# Patient Record
Sex: Male | Born: 1950 | Race: White | Hispanic: No | Marital: Married | Smoking: Current every day smoker
Health system: Southern US, Community
[De-identification: ages and names within clinical notes are randomized; demographics above are authoritative.]

## PROBLEM LIST (undated history)

## (undated) DIAGNOSIS — M1990 Unspecified osteoarthritis, unspecified site: Secondary | ICD-10-CM

## (undated) DIAGNOSIS — L409 Psoriasis, unspecified: Secondary | ICD-10-CM

## (undated) DIAGNOSIS — G8929 Other chronic pain: Secondary | ICD-10-CM

## (undated) DIAGNOSIS — M9110 Juvenile osteochondrosis of head of femur [Legg-Calve-Perthes], unspecified leg: Secondary | ICD-10-CM

## (undated) DIAGNOSIS — I10 Essential (primary) hypertension: Secondary | ICD-10-CM

## (undated) HISTORY — PX: NOSE SURGERY: SHX723

---

## 2018-06-06 ENCOUNTER — Encounter: Payer: Self-pay | Admitting: *Deleted

## 2018-06-06 ENCOUNTER — Emergency Department: Payer: Medicare Other

## 2018-06-06 ENCOUNTER — Other Ambulatory Visit: Payer: Self-pay

## 2018-06-06 ENCOUNTER — Inpatient Hospital Stay
Admission: EM | Admit: 2018-06-06 | Discharge: 2018-06-10 | DRG: 064 | Disposition: A | Discharge status: Other Acute Inpatient Hospital | Payer: Medicare Other | Attending: Internal Medicine | Admitting: Internal Medicine

## 2018-06-06 ENCOUNTER — Inpatient Hospital Stay: Payer: Medicare Other

## 2018-06-06 DIAGNOSIS — E869 Volume depletion, unspecified: Secondary | ICD-10-CM | POA: Diagnosis present

## 2018-06-06 DIAGNOSIS — F17290 Nicotine dependence, other tobacco product, uncomplicated: Secondary | ICD-10-CM | POA: Diagnosis present

## 2018-06-06 DIAGNOSIS — Z886 Allergy status to analgesic agent status: Secondary | ICD-10-CM | POA: Diagnosis not present

## 2018-06-06 DIAGNOSIS — F419 Anxiety disorder, unspecified: Secondary | ICD-10-CM | POA: Diagnosis present

## 2018-06-06 DIAGNOSIS — I639 Cerebral infarction, unspecified: Secondary | ICD-10-CM | POA: Diagnosis present

## 2018-06-06 DIAGNOSIS — Z9911 Dependence on respirator [ventilator] status: Secondary | ICD-10-CM

## 2018-06-06 DIAGNOSIS — I10 Essential (primary) hypertension: Secondary | ICD-10-CM | POA: Diagnosis present

## 2018-06-06 DIAGNOSIS — R451 Restlessness and agitation: Secondary | ICD-10-CM | POA: Diagnosis present

## 2018-06-06 DIAGNOSIS — Z79899 Other long term (current) drug therapy: Secondary | ICD-10-CM

## 2018-06-06 DIAGNOSIS — J690 Pneumonitis due to inhalation of food and vomit: Secondary | ICD-10-CM | POA: Diagnosis present

## 2018-06-06 DIAGNOSIS — L4050 Arthropathic psoriasis, unspecified: Secondary | ICD-10-CM | POA: Diagnosis present

## 2018-06-06 DIAGNOSIS — I340 Nonrheumatic mitral (valve) insufficiency: Secondary | ICD-10-CM | POA: Diagnosis not present

## 2018-06-06 DIAGNOSIS — Z978 Presence of other specified devices: Secondary | ICD-10-CM

## 2018-06-06 DIAGNOSIS — J189 Pneumonia, unspecified organism: Secondary | ICD-10-CM

## 2018-06-06 DIAGNOSIS — J9811 Atelectasis: Secondary | ICD-10-CM | POA: Diagnosis present

## 2018-06-06 DIAGNOSIS — L409 Psoriasis, unspecified: Secondary | ICD-10-CM | POA: Diagnosis present

## 2018-06-06 DIAGNOSIS — R29703 NIHSS score 3: Secondary | ICD-10-CM | POA: Diagnosis present

## 2018-06-06 DIAGNOSIS — Z8051 Family history of malignant neoplasm of kidney: Secondary | ICD-10-CM

## 2018-06-06 DIAGNOSIS — G8929 Other chronic pain: Secondary | ICD-10-CM | POA: Diagnosis present

## 2018-06-06 DIAGNOSIS — J181 Lobar pneumonia, unspecified organism: Secondary | ICD-10-CM | POA: Diagnosis not present

## 2018-06-06 DIAGNOSIS — I959 Hypotension, unspecified: Secondary | ICD-10-CM | POA: Diagnosis present

## 2018-06-06 DIAGNOSIS — Z833 Family history of diabetes mellitus: Secondary | ICD-10-CM | POA: Diagnosis not present

## 2018-06-06 DIAGNOSIS — R42 Dizziness and giddiness: Secondary | ICD-10-CM | POA: Diagnosis present

## 2018-06-06 DIAGNOSIS — N170 Acute kidney failure with tubular necrosis: Secondary | ICD-10-CM | POA: Diagnosis not present

## 2018-06-06 DIAGNOSIS — G4700 Insomnia, unspecified: Secondary | ICD-10-CM | POA: Diagnosis present

## 2018-06-06 DIAGNOSIS — D649 Anemia, unspecified: Secondary | ICD-10-CM | POA: Diagnosis present

## 2018-06-06 DIAGNOSIS — J9621 Acute and chronic respiratory failure with hypoxia: Secondary | ICD-10-CM | POA: Diagnosis not present

## 2018-06-06 DIAGNOSIS — J9690 Respiratory failure, unspecified, unspecified whether with hypoxia or hypercapnia: Secondary | ICD-10-CM | POA: Diagnosis present

## 2018-06-06 DIAGNOSIS — I739 Peripheral vascular disease, unspecified: Secondary | ICD-10-CM | POA: Diagnosis present

## 2018-06-06 DIAGNOSIS — R739 Hyperglycemia, unspecified: Secondary | ICD-10-CM | POA: Diagnosis present

## 2018-06-06 DIAGNOSIS — I63431 Cerebral infarction due to embolism of right posterior cerebral artery: Secondary | ICD-10-CM | POA: Diagnosis not present

## 2018-06-06 DIAGNOSIS — R1310 Dysphagia, unspecified: Secondary | ICD-10-CM | POA: Diagnosis present

## 2018-06-06 DIAGNOSIS — Z4659 Encounter for fitting and adjustment of other gastrointestinal appliance and device: Secondary | ICD-10-CM

## 2018-06-06 DIAGNOSIS — J9600 Acute respiratory failure, unspecified whether with hypoxia or hypercapnia: Secondary | ICD-10-CM

## 2018-06-06 DIAGNOSIS — M47812 Spondylosis without myelopathy or radiculopathy, cervical region: Secondary | ICD-10-CM | POA: Diagnosis present

## 2018-06-06 DIAGNOSIS — J9601 Acute respiratory failure with hypoxia: Secondary | ICD-10-CM | POA: Diagnosis present

## 2018-06-06 DIAGNOSIS — I6389 Other cerebral infarction: Secondary | ICD-10-CM | POA: Diagnosis present

## 2018-06-06 DIAGNOSIS — Z8249 Family history of ischemic heart disease and other diseases of the circulatory system: Secondary | ICD-10-CM

## 2018-06-06 DIAGNOSIS — Z8041 Family history of malignant neoplasm of ovary: Secondary | ICD-10-CM

## 2018-06-06 DIAGNOSIS — Z7982 Long term (current) use of aspirin: Secondary | ICD-10-CM

## 2018-06-06 DIAGNOSIS — Z01818 Encounter for other preprocedural examination: Secondary | ICD-10-CM

## 2018-06-06 DIAGNOSIS — Z800 Family history of malignant neoplasm of digestive organs: Secondary | ICD-10-CM

## 2018-06-06 HISTORY — DX: Unspecified osteoarthritis, unspecified site: M19.90

## 2018-06-06 HISTORY — DX: Juvenile osteochondrosis of head of femur (Legg-Calve-Perthes), unspecified leg: M91.10

## 2018-06-06 HISTORY — DX: Essential (primary) hypertension: I10

## 2018-06-06 HISTORY — DX: Psoriasis, unspecified: L40.9

## 2018-06-06 HISTORY — DX: Other chronic pain: G89.29

## 2018-06-06 LAB — BLOOD GAS, ARTERIAL
Acid-Base Excess: 1.2 mmol/L (ref 0.0–2.0)
Bicarbonate: 26 mmol/L (ref 20.0–28.0)
FIO2: 0.5
O2 SAT: 90.8 %
PEEP: 5 cmH2O
Patient temperature: 37
RATE: 15 resp/min
VT: 500 mL
pCO2 arterial: 41 mmHg (ref 32.0–48.0)
pH, Arterial: 7.41 (ref 7.350–7.450)
pO2, Arterial: 60 mmHg — ABNORMAL LOW (ref 83.0–108.0)

## 2018-06-06 LAB — CBC WITH DIFFERENTIAL/PLATELET
ABS IMMATURE GRANULOCYTES: 0.07 10*3/uL (ref 0.00–0.07)
BASOS PCT: 0 %
Basophils Absolute: 0.1 10*3/uL (ref 0.0–0.1)
Eosinophils Absolute: 0 10*3/uL (ref 0.0–0.5)
Eosinophils Relative: 0 %
HCT: 45.2 % (ref 39.0–52.0)
Hemoglobin: 15 g/dL (ref 13.0–17.0)
Immature Granulocytes: 0 %
Lymphocytes Relative: 6 %
Lymphs Abs: 0.9 10*3/uL (ref 0.7–4.0)
MCH: 30.1 pg (ref 26.0–34.0)
MCHC: 33.2 g/dL (ref 30.0–36.0)
MCV: 90.8 fL (ref 80.0–100.0)
Monocytes Absolute: 1.2 10*3/uL — ABNORMAL HIGH (ref 0.1–1.0)
Monocytes Relative: 8 %
NEUTROS ABS: 13.7 10*3/uL — AB (ref 1.7–7.7)
NEUTROS PCT: 86 %
PLATELETS: 386 10*3/uL (ref 150–400)
RBC: 4.98 MIL/uL (ref 4.22–5.81)
RDW: 13.6 % (ref 11.5–15.5)
WBC: 16 10*3/uL — ABNORMAL HIGH (ref 4.0–10.5)
nRBC: 0 % (ref 0.0–0.2)

## 2018-06-06 LAB — TYPE AND SCREEN
ABO/RH(D): B POS
ANTIBODY SCREEN: NEGATIVE

## 2018-06-06 LAB — GLUCOSE, CAPILLARY: Glucose-Capillary: 132 mg/dL — ABNORMAL HIGH (ref 70–99)

## 2018-06-06 LAB — COMPREHENSIVE METABOLIC PANEL
ALBUMIN: 4.2 g/dL (ref 3.5–5.0)
ALT: 29 U/L (ref 0–44)
AST: 26 U/L (ref 15–41)
Alkaline Phosphatase: 77 U/L (ref 38–126)
Anion gap: 11 (ref 5–15)
BILIRUBIN TOTAL: 0.8 mg/dL (ref 0.3–1.2)
BUN: 16 mg/dL (ref 8–23)
CO2: 26 mmol/L (ref 22–32)
Calcium: 9.7 mg/dL (ref 8.9–10.3)
Chloride: 101 mmol/L (ref 98–111)
Creatinine, Ser: 0.73 mg/dL (ref 0.61–1.24)
GFR calc Af Amer: >60 mL/min (ref >60)
GLUCOSE: 138 mg/dL — AB (ref 70–99)
POTASSIUM: 4 mmol/L (ref 3.5–5.1)
Sodium: 138 mmol/L (ref 135–145)
TOTAL PROTEIN: 8.1 g/dL (ref 6.5–8.1)

## 2018-06-06 LAB — APTT: aPTT: 28 seconds (ref 24–36)

## 2018-06-06 LAB — URINALYSIS, ROUTINE W REFLEX MICROSCOPIC
BACTERIA UA: NONE SEEN
Bilirubin Urine: NEGATIVE
Glucose, UA: NEGATIVE mg/dL
HGB URINE DIPSTICK: NEGATIVE
Ketones, ur: NEGATIVE mg/dL
LEUKOCYTES UA: NEGATIVE
Nitrite: NEGATIVE
Protein, ur: 100 mg/dL — AB
SPECIFIC GRAVITY, URINE: 1.02 (ref 1.005–1.030)
Squamous Epithelial / HPF: NONE SEEN (ref 0–5)
pH: 7 (ref 5.0–8.0)

## 2018-06-06 LAB — MRSA PCR SCREENING: MRSA by PCR: NEGATIVE

## 2018-06-06 LAB — URINE DRUG SCREEN, QUALITATIVE (ARMC ONLY)
AMPHETAMINES, UR SCREEN: NOT DETECTED
BENZODIAZEPINE, UR SCRN: NOT DETECTED
Barbiturates, Ur Screen: NOT DETECTED
Cannabinoid 50 Ng, Ur ~~LOC~~: NOT DETECTED
Cocaine Metabolite,Ur ~~LOC~~: NOT DETECTED
MDMA (ECSTASY) UR SCREEN: NOT DETECTED
Methadone Scn, Ur: NOT DETECTED
Opiate, Ur Screen: NOT DETECTED
Phencyclidine (PCP) Ur S: NOT DETECTED
Tricyclic, Ur Screen: NOT DETECTED

## 2018-06-06 LAB — PROTIME-INR
INR: 0.93
Prothrombin Time: 12.4 seconds (ref 11.4–15.2)

## 2018-06-06 MED ORDER — ENOXAPARIN SODIUM 40 MG/0.4ML ~~LOC~~ SOLN: 40.0000 mg | SUBCUTANEOUS | Status: DC

## 2018-06-06 MED ORDER — FAMOTIDINE IN NACL 20-0.9 MG/50ML-% IV SOLN
20.0000 mg | INTRAVENOUS | Status: DC
  Administered 2018-06-06: 20 mg via INTRAVENOUS
  Filled 2018-06-06: qty 50

## 2018-06-06 MED ORDER — ETOMIDATE 2 MG/ML IV SOLN
30.0000 mg | Freq: Once | INTRAVENOUS | Status: AC
  Administered 2018-06-06: 30 mg via INTRAVENOUS

## 2018-06-06 MED ORDER — DEXMEDETOMIDINE HCL IN NACL 400 MCG/100ML IV SOLN
0.4000 ug/kg/h | INTRAVENOUS | Status: DC
  Administered 2018-06-06 (×3): 0.4 ug/kg/h via INTRAVENOUS
  Filled 2018-06-06 (×3): qty 100

## 2018-06-06 MED ORDER — SUCCINYLCHOLINE CHLORIDE 20 MG/ML IJ SOLN
150.0000 mg | Freq: Once | INTRAMUSCULAR | Status: AC
  Administered 2018-06-06: 150 mg via INTRAVENOUS

## 2018-06-06 MED ORDER — SODIUM CHLORIDE 0.9 % IV SOLN: INTRAVENOUS | Status: DC

## 2018-06-06 MED ORDER — FENTANYL 2500MCG IN NS 250ML (10MCG/ML) PREMIX INFUSION
0.0000 ug/h | INTRAVENOUS | Status: DC
  Administered 2018-06-06 (×2): 150 ug/h via INTRAVENOUS
  Administered 2018-06-09 (×2): 300 ug/h via INTRAVENOUS
  Administered 2018-06-09 – 2018-06-10 (×2): 400 ug/h via INTRAVENOUS
  Administered 2018-06-10: 300 ug/h via INTRAVENOUS
  Filled 2018-06-06 (×9): qty 250

## 2018-06-06 MED ORDER — ASPIRIN 300 MG RE SUPP
300.0000 mg | Freq: Once | RECTAL | Status: AC
  Administered 2018-06-06: 300 mg via RECTAL
  Filled 2018-06-06: qty 1

## 2018-06-06 MED ORDER — IOPAMIDOL (ISOVUE-370) INJECTION 76%
100.0000 mL | Freq: Once | INTRAVENOUS | Status: AC | PRN
  Administered 2018-06-06: 100 mL via INTRAVENOUS

## 2018-06-06 MED ORDER — STROKE: EARLY STAGES OF RECOVERY BOOK
Freq: Once | Status: AC
  Administered 2018-06-07: 18:00:00

## 2018-06-06 MED ORDER — ACETAMINOPHEN 650 MG RE SUPP: 650.0000 mg | RECTAL | Status: DC | PRN

## 2018-06-06 MED ORDER — ACETAMINOPHEN 160 MG/5ML PO SOLN
650.0000 mg | ORAL | Status: DC | PRN
  Administered 2018-06-07 – 2018-06-08 (×2): 650 mg
  Filled 2018-06-06 (×4): qty 20.3

## 2018-06-06 MED ORDER — SENNOSIDES-DOCUSATE SODIUM 8.6-50 MG PO TABS: 1.0000 | ORAL_TABLET | Freq: Every evening | ORAL | Status: DC | PRN

## 2018-06-06 MED ORDER — ONDANSETRON HCL 4 MG/2ML IJ SOLN
4.0000 mg | Freq: Four times a day (QID) | INTRAMUSCULAR | Status: DC | PRN
  Administered 2018-06-06 – 2018-06-10 (×3): 4 mg via INTRAVENOUS
  Filled 2018-06-06 (×4): qty 2

## 2018-06-06 MED ORDER — ACETAMINOPHEN 325 MG PO TABS
650.0000 mg | ORAL_TABLET | ORAL | Status: DC | PRN
  Administered 2018-06-06: 650 mg via ORAL
  Filled 2018-06-06 (×2): qty 2

## 2018-06-06 MED ORDER — ORAL CARE MOUTH RINSE
15.0000 mL | OROMUCOSAL | Status: DC
  Administered 2018-06-06 – 2018-06-10 (×34): 15 mL via OROMUCOSAL

## 2018-06-06 MED ORDER — SODIUM CHLORIDE 0.9 % IV SOLN
100.0000 mL/h | INTRAVENOUS | Status: DC
  Administered 2018-06-06 – 2018-06-07 (×3): 100 mL/h via INTRAVENOUS

## 2018-06-06 MED ORDER — SODIUM CHLORIDE 0.9 % IV BOLUS
500.0000 mL | Freq: Once | INTRAVENOUS | Status: AC
  Administered 2018-06-06: 500 mL via INTRAVENOUS

## 2018-06-06 MED ORDER — SODIUM CHLORIDE 0.9 % IV SOLN
3.0000 g | Freq: Four times a day (QID) | INTRAVENOUS | Status: DC
  Administered 2018-06-06 – 2018-06-10 (×17): 3 g via INTRAVENOUS
  Filled 2018-06-06 (×22): qty 3

## 2018-06-06 MED ORDER — CHLORHEXIDINE GLUCONATE 0.12% ORAL RINSE (MEDLINE KIT)
15.0000 mL | Freq: Two times a day (BID) | OROMUCOSAL | Status: DC
  Administered 2018-06-06 – 2018-06-10 (×9): 15 mL via OROMUCOSAL

## 2018-06-06 NOTE — Consult Note (Signed)
Pharmacy Antibiotic Note  Dillon Bradley is a 67 y.o. male admitted on 06/06/2018 with aspiration pneumonia.  Pharmacy has been consulted for Unasyn dosing.  Plan: Unasyn 3 gm IV every 6 hours  Height: 5\' 8"  (172.7 cm) Weight: 217 lb 9.5 oz (98.7 kg) IBW/kg (Calculated) : 68.4  Temp (24hrs), Avg:98.4 F (36.9 C), Min:98.4 F (36.9 C), Max:98.5 F (36.9 C)  Recent Labs  Lab 06/06/18 0634  WBC 16.0*  CREATININE 0.73    Estimated Creatinine Clearance: 100.6 mL/min (by C-G formula based on SCr of 0.73 mg/dL).    Allergies  Allergen Reactions  . Aspirin     Pt overdosed on ASA fifty years ago.    Antimicrobials this admission: Unasyn 11/20 >>    Dose adjustments this admission:   Microbiology results:  BCx:   UCx:    Sputum:    MRSA PCR:   Thank you for allowing pharmacy to be a part of this patient's care.  Orinda Kennerhris A Tajah Schreiner, PharmD Clinical Pharmacist 06/06/2018 4:43 PM

## 2018-06-06 NOTE — Progress Notes (Signed)
Initial Nutrition Assessment  DOCUMENTATION CODES:   Obesity unspecified  INTERVENTION:   If tube feeds initiated recommend vital HP @40ml /hr + Prostat 60ml BID  Free water flushes 30ml q 4 hours  Regimen provides 1360kcal/day, 144g/day protein, 154445ml/day free water   Recommend liquid MVI daily via tube   NUTRITION DIAGNOSIS:   Inadequate oral intake related to acute illness(pt ventilated ) as evidenced by NPO status.  GOAL:   Provide needs based on ASPEN/SCCM guidelines  MONITOR:   Vent status, Labs, Weight trends, Skin, I & O's  REASON FOR ASSESSMENT:   Ventilator    ASSESSMENT:   [age] y/o male admitted with stroke   Pt alert and oriented, following commands but remains intubated. OGT documented but no radiograph yet to confirm placement. Per chart, pt with insignificant weight loss pta. Suspect pt with good po intake pta. No plans for tube feeds today. RD will monitor for the need for nutrition support.   Medications reviewed and include: lovenox, NaCl @125ml /hr, precedex, fentanyl  Labs reviewed: wbc- 16.0(H)  Patient is currently intubated on ventilator support MV: 7.6 L/min Temp (24hrs), Avg:98.4 F (36.9 C), Min:98.4 F (36.9 C), Max:98.5 F (36.9 C)  Propofol: none  MAP- >5460mmHg  NUTRITION - FOCUSED PHYSICAL EXAM:    Most Recent Value  Orbital Region  No depletion  Upper Arm Region  No depletion  Thoracic and Lumbar Region  No depletion  Buccal Region  No depletion  Temple Region  No depletion  Clavicle Bone Region  No depletion  Clavicle and Acromion Bone Region  No depletion  Scapular Bone Region  No depletion  Dorsal Hand  No depletion  Patellar Region  No depletion  Anterior Thigh Region  No depletion  Posterior Calf Region  No depletion  Edema (RD Assessment)  None  Hair  Reviewed  Eyes  Reviewed  Mouth  Reviewed  Skin  Reviewed  Nails  Reviewed     Diet Order:   Diet Order            Diet NPO time specified  Diet effective  now             EDUCATION NEEDS:   No education needs have been identified at this time  Skin:  Skin Assessment: Reviewed RN Assessment  Last BM:  pta  Height:   Ht Readings from Last 1 Encounters:  06/06/18 5\' 8"  (1.727 m)    Weight:   Wt Readings from Last 1 Encounters:  06/06/18 98.7 kg    Ideal Body Weight:  70 kg  BMI:  Body mass index is 33.09 kg/m.  Estimated Nutritional Needs:   Kcal:  1086-1382kcal/day   Protein:  >140g/day   Fluid:  >2.1L/day   Betsey Holidayasey Alontae Chaloux MS, RD, LDN Pager #- [phone] Office#- [phone] After Hours Pager: [phone]

## 2018-06-06 NOTE — Plan of Care (Signed)
ET pulled back 1 cm and secure at 22 cm lip line

## 2018-06-06 NOTE — Progress Notes (Signed)
Pt was transported to CCU from the ED while on the vent. 

## 2018-06-06 NOTE — Consult Note (Addendum)
67 y  Name: Dillon Bradley MRN: [id number] DOB: 10-Apr-1951    ADMISSION DATE:  06/06/2018 CONSULTATION DATE: 06/06/2018  REFERRING MD : Dr. Manuella Ghazi   CHIEF COMPLAINT: Dizziness and Slurred Speech   BRIEF PATIENT DESCRIPTION:  67 yo male admitted with stroke-like symptoms concerning for acute CVA mechanically intubated for airway protection deemed not a TPA or interventional candidate   SIGNIFICANT EVENTS  11/20-Pt admitted to ICU with stroke-like symptoms mechanically intubated   STUDIES:  CT Head 11/20>>No acute intracranial abnormality. Atrophy and moderate chronic microvascular ischemia. ASPECTS is 10  HISTORY OF PRESENT ILLNESS:  This is a 67 yo male with a PMH of Psoriasis, Legg Calve Perthes Disease, HTN, Chronic Pain, and Arthritis.  She presented to [facility] ER via EMS on 11/20 with acute onset of severe dizziness.  Per ER notes the pts wife reported the pt had not been "acting right" for approximately 17 hrs prior to presentation.  He initially developed dizziness and imbalance primarily swaying to the right.  He also had difficulty swallowing/clearing his throat causing a gurgling sound, difficulty speaking, trouble finding his words, nausea/vomiting, and left facial numbness.  His symptoms worsened the night of 11/19, however the pt initially refused to come to the ER due to worsening symptoms pts wife notified EMS.  Upon arrival to the ER pt continued to have difficulty with speech and word finding.  He was also unable to clear his throat and control his secretions, therefore he was mechanically intubated for airway protection.  Code stroke initiated by ER physician, tele-neurology consulted.  Initial CT head revealed no acute intracranial abnormality APECTS 10, initial NIH stroke scale 3.  Per tele-neurology pt deemed not a candidate for tPA or interventional candidate as symptoms not consistent with a large vessel proximal occlusion. He was subsequently admitted to ICU for additional workup  and treatment.    PAST MEDICAL HISTORY :   has a past medical history of Arthritis, Chronic pain, Hypertension, Legg-Calve-Perthes disease, and Psoriasis.  has a past surgical history that includes Nose surgery. Prior to Admission medications   Medication Sig Start Date End Date Taking? Authorizing Provider  acetaminophen (TYLENOL) 325 MG tablet Take 650 mg by mouth every 6 (six) hours as needed.   Yes [provider]  calcium-vitamin D (OSCAL WITH D) 500-200 MG-UNIT tablet Take 1 tablet by mouth daily.   Yes [provider]  Coenzyme Q10 (COQ10) 100 MG CAPS Take 1 capsule by mouth daily.   Yes [provider]  diclofenac (VOLTAREN) 75 MG EC tablet Take 75 mg by mouth 2 (two) times daily.   Yes [provider]  Multiple Vitamins-Minerals (MULTIVITAMIN ADULTS 50+) TABS Take 1 tablet by mouth daily.   Yes [provider]   Allergies  Allergen Reactions  . Aspirin     Pt overdosed on ASA fifty years ago.    FAMILY HISTORY:  family history includes Arthritis in his brother; Colon cancer in his father; Diabetes in his brother; Hypertension in his father and mother; Kidney cancer in his father; Ovarian cancer in his sister; Stroke in his mother. SOCIAL HISTORY:  reports that he has been smoking cigars. He has never used smokeless tobacco. He reports that he drinks about 4.0 standard drinks of alcohol per week. He reports that he does not use drugs.  REVIEW OF SYSTEMS:   Unable to assess pt intubated   SUBJECTIVE:  Unable to assess pt intubated   VITAL SIGNS: Temp:  [98.4 F (36.9 C)-98.5 F (  36.9 C)] 98.4 F (36.9 C) (11/20 1100) Pulse Rate:  [55-106] 72 (11/20 1100) Resp:  [13-24] 23 (11/20 1100) BP: (107-213)/(72-135) 121/84 (11/20 1100) SpO2:  [94 %-100 %] 97 % (11/20 1149) FiO2 (%):  [40 %-60 %] 40 % (11/20 1149) Weight:  [98.7 kg-104.3 kg] 98.7 kg (11/20 1000)  PHYSICAL EXAMINATION: General: well developed, well nourished male,  NAD mechanically intubated Neuro: sedated, follows commands, BLU and BLL muscle strength 5/5. PERRL  HEENT: supple, no JVD Cardiovascular: sinus brady, no R/G  Lungs: clear throughout, even, non labored  Abdomen: +BS x4, obese, soft, non distended  Musculoskeletal: normal bulk and tone, no edema Skin: intact no rashes or lesions   Recent Labs  Lab 06/06/18 0634  NA 138  K 4.0  CL 101  CO2 26  BUN 16  CREATININE 0.73  GLUCOSE 138*   Recent Labs  Lab 06/06/18 0634  HGB 15.0  HCT 45.2  WBC 16.0*  PLT 386   Ct Angio Head W Or Wo Contrast  Result Date: 06/06/2018 CLINICAL DATA:  Dizziness.  Vomiting.  Stroke. EXAM: CT ANGIOGRAPHY HEAD AND NECK CT PERFUSION BRAIN TECHNIQUE: Multidetector CT imaging of the head and neck was performed using the standard protocol during bolus administration of intravenous contrast. Multiplanar CT image reconstructions and MIPs were obtained to evaluate the vascular anatomy. Carotid stenosis measurements (when applicable) are obtained utilizing NASCET criteria, using the distal internal carotid diameter as the denominator. Multiphase CT imaging of the brain was performed following IV bolus contrast injection. Subsequent parametric perfusion maps were calculated using RAPID software. CONTRAST:  1666m ISOVUE-370 IOPAMIDOL (ISOVUE-370) INJECTION 76% COMPARISON:  CT head 06/06/2018 FINDINGS: CTA NECK FINDINGS Aortic arch: Standard branching. Imaged portion shows no evidence of aneurysm or dissection. No significant stenosis of the major arch vessel origins. Right carotid system: Right carotid widely patent without significant stenosis or atherosclerotic disease Left carotid system: Left carotid widely patent. Mild atherosclerotic disease left carotid bifurcation. Vertebral arteries: Left vertebral artery dominant. Both vertebral arteries are patent in the neck. Distal left vertebral artery stenosis as described below. Skeleton: Cervical spine degenerative change. No  acute skeletal abnormality. Other neck: The patient is intubated. Endotracheal tube in satisfactory position. No soft tissue mass. Upper chest: Mild atelectasis in the lungs bilaterally. Considerable left coronary calcification. Review of the MIP images confirms the above findings CTA HEAD FINDINGS Anterior circulation: Atherosclerotic calcification cavernous carotid bilaterally with mild stenosis bilaterally. Anterior and middle cerebral arteries patent bilaterally without stenosis. Posterior circulation: Circumferential calcific moderate stenosis of the distal left vertebral artery. Right vertebral artery is diminutive but patent to the basilar. Basilar is widely patent. PICA patent bilaterally. Superior cerebellar and posterior cerebral arteries patent bilaterally without stenosis or occlusion. Venous sinuses: Patent Anatomic variants: None Delayed phase: Not performed. Review of the MIP images confirms the above findings CT Brain Perfusion Findings: CBF (<30%) Volume: 038mPerfusion (Tmax>6.0s) volume: 66m266mismatch Volume: 66mL63mfarction Location:Left anterior frontal lobe.  Possible artifact. IMPRESSION: 1. Negative for emergent large vessel occlusion 2. No significant carotid or vertebral artery stenosis in the neck. Moderate stenosis distal left vertebral artery due to calcific stenosis. Basilar widely patent. 3. 4 mL of delayed perfusion left anterior frontal lobe, probable artifact of rapid software Electronically Signed   By: CharFranchot Gallo.   On: 06/06/2018 08:55   Ct Angio Neck W Or Wo Contrast  Result Date: 06/06/2018 CLINICAL DATA:  Dizziness.  Vomiting.  Stroke. EXAM: CT ANGIOGRAPHY HEAD AND NECK CT PERFUSION  BRAIN TECHNIQUE: Multidetector CT imaging of the head and neck was performed using the standard protocol during bolus administration of intravenous contrast. Multiplanar CT image reconstructions and MIPs were obtained to evaluate the vascular anatomy. Carotid stenosis measurements (when  applicable) are obtained utilizing NASCET criteria, using the distal internal carotid diameter as the denominator. Multiphase CT imaging of the brain was performed following IV bolus contrast injection. Subsequent parametric perfusion maps were calculated using RAPID software. CONTRAST:  113m ISOVUE-370 IOPAMIDOL (ISOVUE-370) INJECTION 76% COMPARISON:  CT head 06/06/2018 FINDINGS: CTA NECK FINDINGS Aortic arch: Standard branching. Imaged portion shows no evidence of aneurysm or dissection. No significant stenosis of the major arch vessel origins. Right carotid system: Right carotid widely patent without significant stenosis or atherosclerotic disease Left carotid system: Left carotid widely patent. Mild atherosclerotic disease left carotid bifurcation. Vertebral arteries: Left vertebral artery dominant. Both vertebral arteries are patent in the neck. Distal left vertebral artery stenosis as described below. Skeleton: Cervical spine degenerative change. No acute skeletal abnormality. Other neck: The patient is intubated. Endotracheal tube in satisfactory position. No soft tissue mass. Upper chest: Mild atelectasis in the lungs bilaterally. Considerable left coronary calcification. Review of the MIP images confirms the above findings CTA HEAD FINDINGS Anterior circulation: Atherosclerotic calcification cavernous carotid bilaterally with mild stenosis bilaterally. Anterior and middle cerebral arteries patent bilaterally without stenosis. Posterior circulation: Circumferential calcific moderate stenosis of the distal left vertebral artery. Right vertebral artery is diminutive but patent to the basilar. Basilar is widely patent. PICA patent bilaterally. Superior cerebellar and posterior cerebral arteries patent bilaterally without stenosis or occlusion. Venous sinuses: Patent Anatomic variants: None Delayed phase: Not performed. Review of the MIP images confirms the above findings CT Brain Perfusion Findings: CBF (<30%)  Volume: 049mPerfusion (Tmax>6.0s) volume: 64m51mismatch Volume: 64mL46mfarction Location:Left anterior frontal lobe.  Possible artifact. IMPRESSION: 1. Negative for emergent large vessel occlusion 2. No significant carotid or vertebral artery stenosis in the neck. Moderate stenosis distal left vertebral artery due to calcific stenosis. Basilar widely patent. 3. 4 mL of delayed perfusion left anterior frontal lobe, probable artifact of rapid software Electronically Signed   By: CharFranchot Gallo.   On: 06/06/2018 08:55   Ct Cerebral Perfusion W Contrast  Result Date: 06/06/2018 CLINICAL DATA:  Dizziness.  Vomiting.  Stroke. EXAM: CT ANGIOGRAPHY HEAD AND NECK CT PERFUSION BRAIN TECHNIQUE: Multidetector CT imaging of the head and neck was performed using the standard protocol during bolus administration of intravenous contrast. Multiplanar CT image reconstructions and MIPs were obtained to evaluate the vascular anatomy. Carotid stenosis measurements (when applicable) are obtained utilizing NASCET criteria, using the distal internal carotid diameter as the denominator. Multiphase CT imaging of the brain was performed following IV bolus contrast injection. Subsequent parametric perfusion maps were calculated using RAPID software. CONTRAST:  100mL63mVUE-370 IOPAMIDOL (ISOVUE-370) INJECTION 76% COMPARISON:  CT head 06/06/2018 FINDINGS: CTA NECK FINDINGS Aortic arch: Standard branching. Imaged portion shows no evidence of aneurysm or dissection. No significant stenosis of the major arch vessel origins. Right carotid system: Right carotid widely patent without significant stenosis or atherosclerotic disease Left carotid system: Left carotid widely patent. Mild atherosclerotic disease left carotid bifurcation. Vertebral arteries: Left vertebral artery dominant. Both vertebral arteries are patent in the neck. Distal left vertebral artery stenosis as described below. Skeleton: Cervical spine degenerative change. No acute  skeletal abnormality. Other neck: The patient is intubated. Endotracheal tube in satisfactory position. No soft tissue mass. Upper chest: Mild atelectasis in the lungs bilaterally.  Considerable left coronary calcification. Review of the MIP images confirms the above findings CTA HEAD FINDINGS Anterior circulation: Atherosclerotic calcification cavernous carotid bilaterally with mild stenosis bilaterally. Anterior and middle cerebral arteries patent bilaterally without stenosis. Posterior circulation: Circumferential calcific moderate stenosis of the distal left vertebral artery. Right vertebral artery is diminutive but patent to the basilar. Basilar is widely patent. PICA patent bilaterally. Superior cerebellar and posterior cerebral arteries patent bilaterally without stenosis or occlusion. Venous sinuses: Patent Anatomic variants: None Delayed phase: Not performed. Review of the MIP images confirms the above findings CT Brain Perfusion Findings: CBF (<30%) Volume: 32m Perfusion (Tmax>6.0s) volume: 4323mMismatch Volume: 23m23mnfarction Location:Left anterior frontal lobe.  Possible artifact. IMPRESSION: 1. Negative for emergent large vessel occlusion 2. No significant carotid or vertebral artery stenosis in the neck. Moderate stenosis distal left vertebral artery due to calcific stenosis. Basilar widely patent. 3. 4 mL of delayed perfusion left anterior frontal lobe, probable artifact of rapid software Electronically Signed   By: ChaFranchot GalloD.   On: 06/06/2018 08:55   Dg Chest Portable 1 View  Result Date: 06/06/2018 CLINICAL DATA:  High hypoxia EXAM: PORTABLE CHEST 1 VIEW COMPARISON:  None. FINDINGS: Endotracheal tube tip is 2.2 cm above the carina. No pneumothorax. There is atelectatic change in the left base. There is no edema or consolidation. Heart is upper normal in size with pulmonary vascularity normal. No adenopathy. No bone lesions. IMPRESSION: Endotracheal tube as described without evident  pneumothorax. Mild left base atelectasis. No edema or consolidation. Heart size within normal limits. Electronically Signed   By: WilLowella GripI M.D.   On: 06/06/2018 08:43   Ct Head Code Stroke Wo Contrast  Result Date: 06/06/2018 CLINICAL DATA:  Code stroke. Dizziness. Altered level of consciousness. Abnormal speech. EXAM: CT HEAD WITHOUT CONTRAST TECHNIQUE: Contiguous axial images were obtained from the base of the skull through the vertex without intravenous contrast. COMPARISON:  None. FINDINGS: Brain: Mild atrophy. Chronic microvascular ischemic changes in the white matter. Multiple chronic infarcts in the basal ganglia and pons. Negative for hydrocephalus. Negative for acute infarct, hemorrhage, or mass lesion. Vascular: Atherosclerotic calcification. Negative for hyperdense vessel Skull: Negative Sinuses/Orbits: Mild mucosal edema paranasal sinuses.  Normal orbit Other: None ASPECTS ([place] Program Early CT Score) - Ganglionic level infarction (caudate, lentiform nuclei, internal capsule, insula, M1-M3 cortex): 7 - Supraganglionic infarction (M4-M6 cortex): 3 Total score (0-10 with 10 being normal): 10 IMPRESSION: 1. No acute intracranial abnormality 2. Atrophy and moderate chronic microvascular ischemia 3. ASPECTS is 10 4. These results were called by telephone at the time of interpretation on 06/06/2018 at 6:57 am to Dr. CORHinda Kehrwho verbally acknowledged these results. Electronically Signed   By: ChaFranchot GalloD.   On: 06/06/2018 06:58    ASSESSMENT / PLAN:  Acute hypoxic respiratory failure secondary to aspiration in setting of possible acute CVA  Mechanical Intubation  Hx: Current everyday smoker  Full vent support-vent settings reviewed and established SBT once all parameters met VAP bundle implemented  Prn bronchodilator therapy  Maintain RASS goal 0 to -1 Fentanyl and Precedex gtts to maintain RASS goal  WUA daily  Smoking cessation counseling once  mentation improves   Possible acute CVA Continuous telemetry monitoring Allow for permissive HTN  Echo and MRI brain pending  Frequent neuro checks per code stroke parameters Continue aspirin 300 mg daily Hgb A1c and fasting lipid panel pending  Neurology consulted appreciate input  PT/OT/Speech consulted appreciate input  Aspiration pneumonia and atelectasis. Rt. L airspace disease -Empiric Unasyn. Monitor CXR + CBC + FIO2  VTE px: SCD. Hold on using Heparin products because of risk of hemorrhagic transformation of Acute CVA. SUP px: iv pepcid   Marda Stalker, [place] Pager [phone] (please enter 7 digits) PCCM Consult Pager [phone] (please enter 7 digits)   I agree with the documented

## 2018-06-06 NOTE — Progress Notes (Signed)
Pt was transported to CT and back to ED while on the vent. 

## 2018-06-06 NOTE — Progress Notes (Signed)
Dr. Thad Rangereynolds notified of MRI results- no new orders.

## 2018-06-06 NOTE — Progress Notes (Signed)
OT Cancellation Note  Patient Details Name: Dillon Bradley MRN: [id number] DOB: 30-Oct-1950   Cancelled Treatment:    Reason Eval/Treat Not Completed: Other (comment). Order received, chart reviewed. Per PT, RN requesting hold therapy this date. Pt is currently intubated and becomes agitated when woken, and is also scheduled for an MRI this PM. Will re-attempt OT evaluation at later date/time as pt is medically appropriate.   Richrd PrimeJamie Stiller, MPH, MS, OTR/L ascom [phone] 06/06/18, 1:18 PM

## 2018-06-06 NOTE — ED Notes (Signed)
Pt take to CT with Velva HarmanLaura RN and RT.

## 2018-06-06 NOTE — Progress Notes (Signed)
PT Cancellation Note  Patient Details Name: Dillon CanBryan Bradley MRN: [id number] DOB: 08-Oct-1950   Cancelled Treatment:    Reason Eval/Treat Not Completed: Other (comment)(PT spoke with nursing staff about pt status. Patient is currently intubated and becomes agitated when woken, and is also scheduled for an MRI this PM. Nursing requesting PT to hold at this time. PT will follow up as able. )  Olga Coasteriana Manila Rommel PT, DPT 1:15 PM,06/06/18 [phone]

## 2018-06-06 NOTE — ED Triage Notes (Signed)
Pt c/o dizziness since Tues noon. Pt vomiting x 6 episodes since dizziness started. Pt's speech Is garbled and he is having difficulty saying what he wants to say. Pt states the aphasia has been persistent x several months prior. Pt's speech is gurgling.

## 2018-06-06 NOTE — ED Notes (Signed)
Dr. Sherryll BurgerShah notified of patient's blood pressure. No new orders given at this time. Will continue to monitor.

## 2018-06-06 NOTE — Care Management Note (Signed)
Case Management Note  Patient Details  Name: Dillon Bradley MRN: [id number] Date of Birth: Mar 11, 1951  Subjective/Objective:                 Patient presented from home for sx concerning for stroke.  Several day history of dizziness and aphasia. He was intubated for airway protection due to inability to clear his secretions. Has history of psoriatic arthritis. Per his wife, patient independent in all his adls prior to this episode of illness.  [facility]  [facility]  PT OT and SLP consults are pending. Neuro is following   Action/Plan:   Expected Discharge Date:                  Expected Discharge Plan:     In-House Referral:     Discharge planning Services     Post Acute Care Choice:    Choice offered to:     DME Arranged:    DME Agency:     HH Arranged:    HH Agency:     Status of Service:     If discussed at [employer] Length of Stay Meetings, dates discussed:    Additional Comments:  Eber HongGreene, Briaunna Grindstaff R, RN 06/06/2018, 9:24 AM

## 2018-06-06 NOTE — Progress Notes (Signed)
SLP Cancellation Note  Patient Details Name: Dillon Bradley MRN: [id number] DOB: [birth year]   Cancelled treatment:       Reason Eval/Treat Not Completed: Medical issues which prohibited therapy;Patient not medically ready(chart reviewed; consulted NSG. Pt is orally intubated. ). Pt required oral intubation this AM. ST services will f/u once pt has been extubated. NSG agreed.    Jerilynn Som , MS, CCC-SLP , 06/06/2018, 10:59 AM

## 2018-06-06 NOTE — Consult Note (Signed)
[facility] Tele[facility] Consult Services   TeleStroke Metrics: LKW: 1200 yesterday Door Time: 0626 [facility] Contacted: 0701 [facility] at Bedside: 0707 NIHSS: 0713 Decision on Alteplase: Not to give as his last known well time was yesterday afternoon. Interventional Candidate: Not a candidate as his symptoms are not consistent with a large vessel proximal occlusion.   Chief Complaint: Acute dizziness and slurred speech   HPI: Asked to see this patient in emergent telemedicine consultation utilizing interactive audio and video technologies. ?Consultation was performed with assistance of ancillary / medical staff at bedside.   Verbal consent to perform the examination with telemedicine was obtained. Patient agreed to proceed with the consultation for acute stroke protocol.  67 year old left-handed white male who comes to the emergency room for evaluation of persistent dizziness, slurred speech, and left facial numbness.  Patient's wife is at bedside.  Patient has a listed allergy to aspirin in his medical record.  Wife clarified that the patient has not had any actual anaphylaxis reaction or hives with aspirin.  He apparently overdosed on aspirin many years ago due to pain issues.  Starting around noontime yesterday, the patient had acute dizziness that he describes as room spinning and feeling off balance.  At some point along the way, he developed changes in his speech and left facial numbness.  Wife states that he continued to get worse where he was having nausea and vomiting and it was affecting his walking progressively.  Therefore, he was brought to the ER at this morning.  Patient has never had a stroke before.   PMH: Osteoarthritis and hypertension   SOC: Positive for tobacco abuse and alcohol use.  No illicit drug use.  Patient is married.   FMH: Mother with a history of possible stroke.   ROS: 13 point review systems were reviewed with the patient, and are all  negative with the exception of the aforementioned in the history of present illness.   VS: Temperature is 98.4 F, pulse 79, respiration 13, blood pressure 171/93, oxygen saturation 97%, weight 104 kg   Exam: Patient is in no apparent distress.  Patient appears as stated age.  No obvious acute respiratory or cardiac distress.  Patient is well groomed and well-nourished. 1a- LOC: Keenly responsive - 0     1b- LOC questions: Answers both questions correctly - 0    1c- LOC commands- Performs both tasks correctly- 0    2- Gaze: Normal; no gaze paresis or gaze deviation - 0    3- Visual Fields: normal, no Visual field deficit - 0    4- Facial movements: right facial palsy - 1    5- Upper limb motor - no drift - 0    6- Lower limb motor - bilateral leg drifts - 2     7- Limb Coordination: absent ataxia - 0     8- Sensory: left facial sensory loss - 1 9- Language - No aphasia - 0     10- Speech - Moderate dysarthria - 1 11- Neglect / Extinction - none found - 0   NIHSS score:  5   Diagnostic Data: CT of the head showed no acute intracranial process  Sodium 138, potassium 4, BUN 16, creatinine 0.73, blood glucose 138, WBC 16, hemoglobin 15, platelets 386   Medical Data Reviewed:   1.Data?reviewed include clinical labs, radiology,?and medical tests;   2.Tests?results discussed w/performing or interpreting physician;   3.Obtaining/reviewing old medical records;   4.Obtaining?case history from another source;   5.Independent?review of image,  tracing, or specimen.    Medical Decision Making:   - Extensive number of diagnosis or management options are considered below.   - Extensive amount of complex data reviewed.   - High risk of complication and/or morbidity or mortality are associated with differential diagnostic considerations below.   - There may be?uncertain?outcome and increased probability of prolonged functional impairment or high probability of severe prolonged functional impairment  associated with some of these differential diagnosis.    Differential Diagnosis for Stroke:   1.?Cardioembolic?stroke   2. Small vessel disease/lacune   3. Thromboembolic, artery-to-artery mechanism   4.?Hypercoagulable?state-related infarct   5. Transient ischemic attack   6. Thrombotic mechanism, large artery disease    Assessment: 1.  Acute posterior circulation/brainstem stroke 2.  Hypertension 3.  Osteoarthritis 4.  Tobacco abuse   Recommendations: Check CTA of the head and neck to evaluate his intracranial and extracranial blood vessels, with particular attention to the posterior circulation. Patient can be admitted to the hospital for further work-up of his symptoms. Consult local neurology team to assist with evaluation and management. Start the patient on a baby aspirin. Allow permissive hypertension. Check MRI brain without contrast to rule out any acute intracranial process. Check echocardiogram to gauge his cardiac function. Maintain the patient on telemetry to look for paroxysmal atrial fibrillation. Check hemoglobin A1c and lipid panel. Consult PT, OT, and ST. Continue supportive care. Plan of care was discussed with the patient and his wife.   Thank you for allowing [facility] to participate in the care of your patient. Please call me, Dr. Adrienne MochaSombutmai, with any questions at [phone]. Case discussed with the ER staff and Dr. Scotty CourtStafford.   Critical Care notation:   I was called to see this critical patient emergently. I personally evaluated this critical patient for acute stroke evaluation, and determining their eligibility for IV Alteplase and interventional therapies.  I have spent approximately 10 minutes with the patient, including time at bedside, time discussing the case with other physicians, reviewing plan of care, and time independently reviewing the records and scans.

## 2018-06-06 NOTE — H&P (Addendum)
[facility] [place] [facility]   PATIENT NAME: Dillon Bradley    MR#:  [id number]  DATE OF BIRTH:  01-Aug-1950  DATE OF ADMISSION:  06/06/2018  PRIMARY CARE PHYSICIAN: Carolynn Comment  REQUESTING/REFERRING PHYSICIAN: Loleta Rose, MD  CHIEF COMPLAINT:   Chief Complaint  Patient presents with  . Dizziness    HISTORY OF PRESENT ILLNESS:  Dillon Bradley  is a 67 y.o. male with a known history of Psoriatic arthritis, hypertension, chronic back and neck pain admitted for suspected stroke. Patient was intubated when I saw. Wife at bedside, history obtained talking with wife, EDP and reviewing records. Patient has had ear infection and thought that his symptoms were due to this.  He developed an episode of severe dizziness at around noon yesterday  that seemed to be persistent as the day progressed.  He also developed difficulty swallowing or clearing his throat and is speaking with a gurgling sound in ED. EDP intubated him for airway protection as there was concern that he's unable to clear his secretions. PAST MEDICAL HISTORY:   Past Medical History:  Diagnosis Date  . Arthritis   . Chronic pain   . Hypertension   . Legg-Calve-Perthes disease   . Psoriasis    PAST SURGICAL HISTORY:   Past Surgical History:  Procedure Laterality Date  . NOSE SURGERY      SOCIAL HISTORY:   Social History   Tobacco Use  . Smoking status: Current Every Day Smoker    Types: Cigars  . Smokeless tobacco: Never Used  Substance Use Topics  . Alcohol use: Yes    Alcohol/week: 4.0 standard drinks    Types: 4 Cans of beer per week    FAMILY HISTORY:  History reviewed. No pertinent family history. Mother: stroke/heart issue Brother: DM Sister: ovarian ca DRUG ALLERGIES:   Allergies  Allergen Reactions  . Aspirin     Pt overdosed on ASA fifty years ago.    REVIEW OF SYSTEMS:  ROS: unable obtain as patient intubated MEDICATIONS AT HOME:   Prior to Admission  medications   Medication Sig Start Date End Date Taking? Authorizing Provider  diclofenac (VOLTAREN) 75 MG EC tablet Take 75 mg by mouth 2 (two) times daily.   Yes [provider]      VITAL SIGNS:  Blood pressure 110/74, pulse 62, temperature 98.4 F (36.9 C), temperature source Oral, resp. rate (!) 21, height 5\' 8"  (1.727 m), weight 104.3 kg, SpO2 99 %.  PHYSICAL EXAMINATION:  Physical Exam  GENERAL:  67 y.o.-year-old patient lying in the bed with no acute distress.  EYES: Pupils equal, round, reactive to light and accommodation. No scleral icterus. Extraocular muscles intact.  HEENT: Head atraumatic, normocephalic. Oropharynx and nasopharynx clear. ETT in place NECK:  Supple, no jugular venous distention. No thyroid enlargement, no tenderness.  LUNGS: Normal breath sounds bilaterally, no wheezing, rales,rhonchi or crepitation. No use of accessory muscles of respiration.  CARDIOVASCULAR: S1, S2 normal. No murmurs, rubs, or gallops.  ABDOMEN: Soft, nontender, nondistended. Bowel sounds present. No organomegaly or mass.  EXTREMITIES: No pedal edema, cyanosis, or clubbing.  NEUROLOGIC: Seems nonfocal but difficult to evaluate as he's on vent PSYCHIATRIC: The patient is awake, on vent. Follows simple commands SKIN: No obvious rash, lesion, or ulcer.  LABORATORY PANEL:   CBC Recent Labs  Lab 06/06/18 0634  WBC 16.0*  HGB 15.0  HCT 45.2  PLT 386   ------------------------------------------------------------------------------------------------------------------  Chemistries  Recent Labs  Lab 06/06/18  0634  NA 138  K 4.0  CL 101  CO2 26  GLUCOSE 138*  BUN 16  CREATININE 0.73  CALCIUM 9.7  AST 26  ALT 29  ALKPHOS 77  BILITOT 0.8   ------------------------------------------------------------------------------------------------------------------  Cardiac Enzymes No results for input(s): TROPONINI in the last 168  hours. ------------------------------------------------------------------------------------------------------------------  RADIOLOGY:  Ct Angio Head W Or Wo Contrast  Result Date: 06/06/2018 CLINICAL DATA:  Dizziness.  Vomiting.  Stroke. EXAM: CT ANGIOGRAPHY HEAD AND NECK CT PERFUSION BRAIN TECHNIQUE: Multidetector CT imaging of the head and neck was performed using the standard protocol during bolus administration of intravenous contrast. Multiplanar CT image reconstructions and MIPs were obtained to evaluate the vascular anatomy. Carotid stenosis measurements (when applicable) are obtained utilizing NASCET criteria, using the distal internal carotid diameter as the denominator. Multiphase CT imaging of the brain was performed following IV bolus contrast injection. Subsequent parametric perfusion maps were calculated using RAPID software. CONTRAST:  ISOVUE-370 IOPAMIDOL (ISOVUE-370) INJECTION 76% COMPARISON:  CT head 06/06/2018 FINDINGS: CTA NECK FINDINGS Aortic arch: Standard branching. Imaged portion shows no evidence of aneurysm or dissection. No significant stenosis of the major arch vessel origins. Right carotid system: Right carotid widely patent without significant stenosis or atherosclerotic disease Left carotid system: Left carotid widely patent. Mild atherosclerotic disease left carotid bifurcation. Vertebral arteries: Left vertebral artery dominant. Both vertebral arteries are patent in the neck. Distal left vertebral artery stenosis as described below. Skeleton: Cervical spine degenerative change. No acute skeletal abnormality. Other neck: The patient is intubated. Endotracheal tube in satisfactory position. No soft tissue mass. Upper chest: Mild atelectasis in the lungs bilaterally. Considerable left coronary calcification. Review of the MIP images confirms the above findings CTA HEAD FINDINGS Anterior circulation: Atherosclerotic calcification cavernous carotid bilaterally with mild  stenosis bilaterally. Anterior and middle cerebral arteries patent bilaterally without stenosis. Posterior circulation: Circumferential calcific moderate stenosis of the distal left vertebral artery. Right vertebral artery is diminutive but patent to the basilar. Basilar is widely patent. PICA patent bilaterally. Superior cerebellar and posterior cerebral arteries patent bilaterally without stenosis or occlusion. Venous sinuses: Patent Anatomic variants: None Delayed phase: Not performed. Review of the MIP images confirms the above findings CT Brain Perfusion Findings: CBF (<30%) Volume: 0mL Perfusion (Tmax>6.0s) volume: 4mL Mismatch Volume: 4mL Infarction Location:Left anterior frontal lobe.  Possible artifact. IMPRESSION: 1. Negative for emergent large vessel occlusion 2. No significant carotid or vertebral artery stenosis in the neck. Moderate stenosis distal left vertebral artery due to calcific stenosis. Basilar widely patent. 3. 4 mL of delayed perfusion left anterior frontal lobe, probable artifact of rapid software Electronically Signed   By: Marlan Palau M.D.   On: 06/06/2018 08:55   Ct Angio Neck W Or Wo Contrast  Result Date: 06/06/2018 CLINICAL DATA:  Dizziness.  Vomiting.  Stroke. EXAM: CT ANGIOGRAPHY HEAD AND NECK CT PERFUSION BRAIN TECHNIQUE: Multidetector CT imaging of the head and neck was performed using the standard protocol during bolus administration of intravenous contrast. Multiplanar CT image reconstructions and MIPs were obtained to evaluate the vascular anatomy. Carotid stenosis measurements (when applicable) are obtained utilizing NASCET criteria, using the distal internal carotid diameter as the denominator. Multiphase CT imaging of the brain was performed following IV bolus contrast injection. Subsequent parametric perfusion maps were calculated using RAPID software. CONTRAST:  ISOVUE-370 IOPAMIDOL (ISOVUE-370) INJECTION 76% COMPARISON:  CT head 06/06/2018 FINDINGS: CTA NECK  FINDINGS Aortic arch: Standard branching. Imaged portion shows no evidence of aneurysm or dissection. No significant stenosis  of the major arch vessel origins. Right carotid system: Right carotid widely patent without significant stenosis or atherosclerotic disease Left carotid system: Left carotid widely patent. Mild atherosclerotic disease left carotid bifurcation. Vertebral arteries: Left vertebral artery dominant. Both vertebral arteries are patent in the neck. Distal left vertebral artery stenosis as described below. Skeleton: Cervical spine degenerative change. No acute skeletal abnormality. Other neck: The patient is intubated. Endotracheal tube in satisfactory position. No soft tissue mass. Upper chest: Mild atelectasis in the lungs bilaterally. Considerable left coronary calcification. Review of the MIP images confirms the above findings CTA HEAD FINDINGS Anterior circulation: Atherosclerotic calcification cavernous carotid bilaterally with mild stenosis bilaterally. Anterior and middle cerebral arteries patent bilaterally without stenosis. Posterior circulation: Circumferential calcific moderate stenosis of the distal left vertebral artery. Right vertebral artery is diminutive but patent to the basilar. Basilar is widely patent. PICA patent bilaterally. Superior cerebellar and posterior cerebral arteries patent bilaterally without stenosis or occlusion. Venous sinuses: Patent Anatomic variants: None Delayed phase: Not performed. Review of the MIP images confirms the above findings CT Brain Perfusion Findings: CBF (<30%) Volume: 0mL Perfusion (Tmax>6.0s) volume: 4mL Mismatch Volume: 4mL Infarction Location:Left anterior frontal lobe.  Possible artifact. IMPRESSION: 1. Negative for emergent large vessel occlusion 2. No significant carotid or vertebral artery stenosis in the neck. Moderate stenosis distal left vertebral artery due to calcific stenosis. Basilar widely patent. 3. 4 mL of delayed perfusion left  anterior frontal lobe, probable artifact of rapid software Electronically Signed   By: Marlan Palauharles  Clark M.D.   On: 06/06/2018 08:55   Ct Cerebral Perfusion W Contrast  Result Date: 06/06/2018 CLINICAL DATA:  Dizziness.  Vomiting.  Stroke. EXAM: CT ANGIOGRAPHY HEAD AND NECK CT PERFUSION BRAIN TECHNIQUE: Multidetector CT imaging of the head and neck was performed using the standard protocol during bolus administration of intravenous contrast. Multiplanar CT image reconstructions and MIPs were obtained to evaluate the vascular anatomy. Carotid stenosis measurements (when applicable) are obtained utilizing NASCET criteria, using the distal internal carotid diameter as the denominator. Multiphase CT imaging of the brain was performed following IV bolus contrast injection. Subsequent parametric perfusion maps were calculated using RAPID software. CONTRAST:  100mL ISOVUE-370 IOPAMIDOL (ISOVUE-370) INJECTION 76% COMPARISON:  CT head 06/06/2018 FINDINGS: CTA NECK FINDINGS Aortic arch: Standard branching. Imaged portion shows no evidence of aneurysm or dissection. No significant stenosis of the major arch vessel origins. Right carotid system: Right carotid widely patent without significant stenosis or atherosclerotic disease Left carotid system: Left carotid widely patent. Mild atherosclerotic disease left carotid bifurcation. Vertebral arteries: Left vertebral artery dominant. Both vertebral arteries are patent in the neck. Distal left vertebral artery stenosis as described below. Skeleton: Cervical spine degenerative change. No acute skeletal abnormality. Other neck: The patient is intubated. Endotracheal tube in satisfactory position. No soft tissue mass. Upper chest: Mild atelectasis in the lungs bilaterally. Considerable left coronary calcification. Review of the MIP images confirms the above findings CTA HEAD FINDINGS Anterior circulation: Atherosclerotic calcification cavernous carotid bilaterally with mild stenosis  bilaterally. Anterior and middle cerebral arteries patent bilaterally without stenosis. Posterior circulation: Circumferential calcific moderate stenosis of the distal left vertebral artery. Right vertebral artery is diminutive but patent to the basilar. Basilar is widely patent. PICA patent bilaterally. Superior cerebellar and posterior cerebral arteries patent bilaterally without stenosis or occlusion. Venous sinuses: Patent Anatomic variants: None Delayed phase: Not performed. Review of the MIP images confirms the above findings CT Brain Perfusion Findings: CBF (<30%) Volume: 0mL Perfusion (Tmax>6.0s) volume: 4mL Mismatch Volume:  4mL Infarction Location:Left anterior frontal lobe.  Possible artifact. IMPRESSION: 1. Negative for emergent large vessel occlusion 2. No significant carotid or vertebral artery stenosis in the neck. Moderate stenosis distal left vertebral artery due to calcific stenosis. Basilar widely patent. 3. 4 mL of delayed perfusion left anterior frontal lobe, probable artifact of rapid software Electronically Signed   By: Marlan Palau M.D.   On: 06/06/2018 08:55   Dg Chest Portable 1 View  Result Date: 06/06/2018 CLINICAL DATA:  High hypoxia EXAM: PORTABLE CHEST 1 VIEW COMPARISON:  None. FINDINGS: Endotracheal tube tip is 2.2 cm above the carina. No pneumothorax. There is atelectatic change in the left base. There is no edema or consolidation. Heart is upper normal in size with pulmonary vascularity normal. No adenopathy. No bone lesions. IMPRESSION: Endotracheal tube as described without evident pneumothorax. Mild left base atelectasis. No edema or consolidation. Heart size within normal limits. Electronically Signed   By: Bretta Bang III M.D.   On: 06/06/2018 08:43   Ct Head Code Stroke Wo Contrast  Result Date: 06/06/2018 CLINICAL DATA:  Code stroke. Dizziness. Altered level of consciousness. Abnormal speech. EXAM: CT HEAD WITHOUT CONTRAST TECHNIQUE: Contiguous axial images  were obtained from the base of the skull through the vertex without intravenous contrast. COMPARISON:  None. FINDINGS: Brain: Mild atrophy. Chronic microvascular ischemic changes in the white matter. Multiple chronic infarcts in the basal ganglia and pons. Negative for hydrocephalus. Negative for acute infarct, hemorrhage, or mass lesion. Vascular: Atherosclerotic calcification. Negative for hyperdense vessel Skull: Negative Sinuses/Orbits: Mild mucosal edema paranasal sinuses.  Normal orbit Other: None ASPECTS (Alberta Stroke Program Early CT Score) - Ganglionic level infarction (caudate, lentiform nuclei, internal capsule, insula, M1-M3 cortex): 7 - Supraganglionic infarction (M4-M6 cortex): 3 Total score (0-10 with 10 being normal): 10 IMPRESSION: 1. No acute intracranial abnormality 2. Atrophy and moderate chronic microvascular ischemia 3. ASPECTS is 10 4. These results were called by telephone at the time of interpretation on 06/06/2018 at 6:57 am to Dr. Loleta Rose , who verbally acknowledged these results. Electronically Signed   By: Marlan Palau M.D.   On: 06/06/2018 06:58   IMPRESSION AND PLAN:  74 y m with suspected stroke  * Stroke - echo, MRI brain -PT, OT, ST c/s - Neuro c/s - asa + statin  * Acute resp failure - intubated for airway protection - vent mgmt per PCCM  * Hypotension; transient while in ED - likely due to fentanyl given for intubation - monitor  * Hyperglycemia - monitor sugars   All the records are reviewed and case discussed with ED provider. Management plans discussed with the patient, family (wife at bedside) and they are in agreement.  CODE STATUS: FULL CODE  TOTAL TIME (Critical Care) TAKING CARE OF THIS PATIENT: 45 minutes.    Delfino Lovett M.D on 06/06/2018 at 9:17 AM  Between 7am to 6pm - Pager - [phone]  After 6pm go to [email or web address] - Social research officer, government  [facility]  Office  [phone]  CC: Primary care  physician; Patient, No Pcp Per   Note: This dictation was prepared with Dragon dictation along with smaller phrase technology. Any transcriptional errors that result from this process are unintentional.

## 2018-06-06 NOTE — ED Notes (Signed)
Verbal order given for 100 mcg bolus of fentanyl by Dr. Scotty CourtStafford

## 2018-06-06 NOTE — ED Provider Notes (Addendum)
[facility] Emergency Department Provider Note  ____________________________________________   None    (approximate)  I have reviewed the triage vital signs and the nursing notes.   HISTORY  Chief Complaint Dizziness  Level 5 caveat:  history/ROS limited by acute/critical illness  HPI Dillon Bradley is a 66 y.o. male who presents by EMS for acute onset severe dizziness.  According to the patient and his wife who is also present in the ED, the patient has not been acting right since about 1 PM yesterday (approximately 17 hours ago).  It started with some dizziness and imbalance which is gotten progressively worse, but he also developed difficulty swallowing or clearing his throat and is speaking with a gurgling sound because he cannot clear his phlegm.  He reports that he is having trouble finding the words he wants to say and he is answering questions slowly, according to his wife more slowly than usual.  The primary symptom tonight that prompted the call to EMS was the patient severe dizziness.  He said that he feels weak and that he cannot support himself and that he also feels like the room is spinning and he is off balance.  His wife helped him up tonight but then had to help him down onto the floor because he cannot maintain his balance to get back to the bed.  No history of recent fevers.  He denies shortness of breath, chest pain, nausea, vomiting, and abdominal pain.  He has no history of hypertension but his blood pressure is elevated upon arrival.  Past Medical History:  Diagnosis Date  . Arthritis   . Chronic pain   . Hypertension   . Legg-Calve-Perthes disease   . Psoriasis     There are no active problems to display for this patient.   Past Surgical History:  Procedure Laterality Date  . NOSE SURGERY      Prior to Admission medications   Medication Sig Start Date End Date Taking? Authorizing Provider  diclofenac (VOLTAREN) 75 MG EC tablet  Take 75 mg by mouth 2 (two) times daily.   Yes [provider]    Allergies Aspirin  History reviewed. No pertinent family history.  Social History Social History   Tobacco Use  . Smoking status: Current Every Day Smoker    Types: Cigars  . Smokeless tobacco: Never Used  Substance Use Topics  . Alcohol use: Yes    Alcohol/week: 4.0 standard drinks    Types: 4 Cans of beer per week  . Drug use: Never    Review of Systems Insert level 5 caveat.  See above for details.  ____________________________________________   PHYSICAL EXAM:  VITAL SIGNS: ED Triage Vitals  Enc Vitals Group     BP 06/06/18 0640 (!) 171/93     Pulse --      Resp 06/06/18 0640 13     Temp 06/06/18 0640 98.4 F (36.9 C)     Temp Source 06/06/18 0640 Oral     SpO2 06/06/18 0628 97 %     Weight 06/06/18 0641 104.3 kg (230 lb)     Height 06/06/18 0641 1.727 m (5\' 8" )     Head Circumference --      Peak Flow --      Pain Score 06/06/18 0641 0     Pain Loc --      Pain Edu? --      Excl. in GC? --     Constitutional: Alert and oriented  but "off".  Slow to answer questions.  Seems relatively unconcerned by his symptoms in spite of the severity of them. Eyes: Conjunctivae are normal. PERRL. EOMI. no nystagmus Head: Atraumatic. Nose: No congestion/rhinnorhea. Mouth/Throat: Mucous membranes are moist.  Patient is gurgling and clearly has some secretions that he is not been able to swallow. Neck: No stridor.  No meningeal signs.   Cardiovascular: Normal rate, regular rhythm. Good peripheral circulation. Grossly normal heart sounds. Respiratory: Normal respiratory effort.  No retractions. Lungs CTAB.  Protecting his airway. Gastrointestinal: Soft and nontender. No distention.  Musculoskeletal: No lower extremity tenderness nor edema. No gross deformities of extremities. Neurologic: Somewhat garbled speech and language.  He has what appears to be normal strength in his upper and lower  extremities.  He has some left-sided facial/mouth droop. Skin:  Skin is warm, dry and intact. No rash noted.  NIH Stroke Scale  Interval: Baseline Time: 6:42 AM Person Administering Scale: Loleta RoseCory Avram Danielson  Administer stroke scale items in the order listed. Record performance in each category after each subscale exam. Do not go back and change scores. Follow directions provided for each exam technique. Scores should reflect what the patient does, not what the clinician thinks the patient can do. The clinician should record answers while administering the exam and work quickly. Except where indicated, the patient should not be coached (i.e., repeated requests to patient to make a special effort).   1a  Level of consciousness: 0=alert; keenly responsive  1b. LOC questions:  0=Performs both tasks correctly  1c. LOC commands: 0=Performs both tasks correctly  2.  Best Gaze: 0=normal  3.  Visual: 0=No visual loss  4. Facial Palsy: 1=Minor paralysis (flattened nasolabial fold, asymmetric on smiling)  5a.  Motor left arm: 0=No drift, limb holds 90 (or 45) degrees for full 10 seconds  5b.  Motor right arm: 0=No drift, limb holds 90 (or 45) degrees for full 10 seconds  6a. motor left leg: 0=No drift, limb holds 90 (or 45) degrees for full 10 seconds  6b  Motor right leg:  0=No drift, limb holds 90 (or 45) degrees for full 10 seconds  7. Limb Ataxia: 0=Absent  8.  Sensory: 0=Normal; no sensory loss  9. Best Language:  1=Mild to moderate aphasia; some obvious loss of fluency or facility of comprehension without significant limitation on ideas expressed or form of expression.  10. Dysarthria: 1=Mild to moderate, patient slurs at least some words and at worst, can be understood with some difficulty  11. Extinction and Inattention: 0=No abnormality  12. Distal motor function: 0=Normal   Total:   3     ____________________________________________   LABS (all labs ordered are listed, but only abnormal  results are displayed)  Labs Reviewed  COMPREHENSIVE METABOLIC PANEL - Abnormal; Notable for the following components:      Result Value   Glucose, Bld 138 (*)    All other components within normal limits  CBC WITH DIFFERENTIAL/PLATELET - Abnormal; Notable for the following components:   WBC 16.0 (*)    Neutro Abs 13.7 (*)    Monocytes Absolute 1.2 (*)    All other components within normal limits  APTT  PROTIME-INR  URINE DRUG SCREEN, QUALITATIVE ([facility] ONLY)  URINALYSIS, ROUTINE W REFLEX MICROSCOPIC  TYPE AND SCREEN   ____________________________________________  EKG  ED ECG REPORT I, Loleta Roseory Berlynn Warsame, the attending physician, personally viewed and interpreted this ECG.  Date: 06/06/2018 EKG Time: 6:54 AM Rate: 94 Rhythm: normal sinus rhythm QRS Axis:  normal Intervals: normal ST/T Wave abnormalities: normal Narrative Interpretation: no evidence of acute ischemia  ____________________________________________  RADIOLOGY I, Loleta Rose, personally discussed these images and results by phone with the on-call radiologist and used this discussion as part of my medical decision making.   ED MD interpretation: No evidence of acute CVA on noncontrast head CT.  CTA imaging is pending.  Official radiology report(s): Ct Head Code Stroke Wo Contrast  Result Date: 06/06/2018 CLINICAL DATA:  Code stroke. Dizziness. Altered level of consciousness. Abnormal speech. EXAM: CT HEAD WITHOUT CONTRAST TECHNIQUE: Contiguous axial images were obtained from the base of the skull through the vertex without intravenous contrast. COMPARISON:  None. FINDINGS: Brain: Mild atrophy. Chronic microvascular ischemic changes in the white matter. Multiple chronic infarcts in the basal ganglia and pons. Negative for hydrocephalus. Negative for acute infarct, hemorrhage, or mass lesion. Vascular: Atherosclerotic calcification. Negative for hyperdense vessel Skull: Negative Sinuses/Orbits: Mild mucosal edema  paranasal sinuses.  Normal orbit Other: None ASPECTS (Alberta Stroke Program Early CT Score) - Ganglionic level infarction (caudate, lentiform nuclei, internal capsule, insula, M1-M3 cortex): 7 - Supraganglionic infarction (M4-M6 cortex): 3 Total score (0-10 with 10 being normal): 10 IMPRESSION: 1. No acute intracranial abnormality 2. Atrophy and moderate chronic microvascular ischemia 3. ASPECTS is 10 4. These results were called by telephone at the time of interpretation on 06/06/2018 at 6:57 am to Dr. Loleta Rose , who verbally acknowledged these results. Electronically Signed   By: Marlan Palau M.D.   On: 06/06/2018 06:58    ____________________________________________   PROCEDURES  Critical Care performed: Yes, see critical care procedure note(s)   Procedure(s) performed:   .Critical Care Performed by: Loleta Rose, MD Authorized by: Loleta Rose, MD   Critical care provider statement:    Critical care time (minutes):  45   Critical care time was exclusive of:  Separately billable procedures and treating other patients   Critical care was necessary to treat or prevent imminent or life-threatening deterioration of the following conditions:  CNS failure or compromise   Critical care was time spent personally by me on the following activities:  Development of treatment plan with patient or surrogate, discussions with consultants, evaluation of patient's response to treatment, examination of patient, obtaining history from patient or surrogate, ordering and performing treatments and interventions, ordering and review of laboratory studies, ordering and review of radiographic studies, pulse oximetry, re-evaluation of patient's condition and review of old charts Procedure Name: Intubation Date/Time: 06/06/2018 7:55 AM Performed by: Loleta Rose, MD Pre-anesthesia Checklist: Patient identified, Emergency Drugs available, Suction available and Patient being monitored Oxygen Delivery  Method: Nasal cannula Preoxygenation: Pre-oxygenation with 100% oxygen Induction Type: IV induction, Rapid sequence and Cricoid Pressure applied Laryngoscope Size: Glidescope Tube size: 7.5 mm Number of attempts: 1 Placement Confirmation: ETT inserted through vocal cords under direct vision,  CO2 detector and Breath sounds checked- equal and bilateral Dental Injury: Teeth and Oropharynx as per pre-operative assessment         ____________________________________________   INITIAL IMPRESSION / ASSESSMENT AND PLAN / ED COURSE  As part of my medical decision making, I reviewed the following data within the electronic MEDICAL RECORD NUMBER History obtained from family, Nursing notes reviewed and incorporated, Labs reviewed , EKG interpreted , Old chart reviewed, Discussed with admitting physician , Discussed with radiologist and A consult was requested and obtained from this/these consultant(s) Neurology    6:45am.  Differential diagnosis includes, but is not limited to, CVA (acute or subacute), TIA, acute  infection, metabolic or electrolyte abnormality, medication side effect, substance overdose.  The last known normal was approximately 17 hours ago but those symptoms were relatively mild and it is unclear if it was related to the a CVA.  However the symptoms got much worse within the last couple of hours and I feel it is better to air on the side of caution and activate code stroke to bring all the necessary resources to bear then to wait and perhaps miss a window of opportunity for treatment.  The NIH stroke scale is 3 currently but his symptoms are quite profound including an inability to clear his throat or handle his secretions.  He is going over right now for a stat head CT without contrast per code stroke protocol and then will get a tele-neurology evaluation.  Blood work is pending.   Clinical Course as of Jun 07 811  Wed Jun 06, 2018  8413 Spoke with radiology, no evidence of bleeding,  "fairly severe chronic ischemia" including in the cerebellum.    [CF]  [phone] The tele-neurologist spoke with my colleague Dr. Scotty Court because I was not at my desk.  He recommended no TPA given the time of onset and the unclear etiology of the symptoms, a recommendation with which I agree.  He also recommended CTA for further evaluation of posterior circulation.  I have ordered CTA head, neck, and CT perfusion study for full and complete evaluation of possible CVA.I had a risks and benefit discussions with the patient and his wife.  Over the course of his evaluation, his difficulty swallowing was getting steadily worse.  He was sitting straight up and his speech was nearly unintelligible both from garbled speech likely secondary to CVA but also because of the gurgling of his saliva which he was unable to swallow.Given the risk to his airway and his inability to lie flat which will prevent his obtaining additional CT imaging or MRIs.  Given the choice, the patient prefers elective urgent intubation at this time.  We went over the risks and benefits and he understands and agrees with the plan.  Verbal consent was obtained from him and his wife.  He was intubated as per the procedure note without any difficulty although he did quickly desaturate down to about 60%.  He came up quickly with bagging after the tube was placed.  Airway was full and thick with secretions but no emesis.  I have paged the hospitalist to discuss plans for admission.   [CF]  0809 Discussed with Dr. Sherryll Burger by phone who will admit.   [CF]    Clinical Course User Index [CF] Loleta Rose, MD    ____________________________________________  FINAL CLINICAL IMPRESSION(S) / ED DIAGNOSES  Final diagnoses:  Cerebrovascular accident (CVA), unspecified mechanism (HCC)  Acute respiratory failure, unspecified whether with hypoxia or hypercapnia (HCC)     MEDICATIONS GIVEN DURING THIS VISIT:  Medications  fentaNYL in NS  (81mcg/ml) infusion-PREMIX (175 mcg/hr Intravenous Rate/Dose Change 06/06/18 0756)  dexmedetomidine (PRECEDEX) 400 MCG/100ML (4 mcg/mL) infusion (0.5 mcg/kg/hr  104.3 kg Intravenous Rate/Dose Change 06/06/18 0754)  sodium chloride 0.9 % bolus 500 mL (500 mLs Intravenous New Bag/Given 06/06/18 0801)    Followed by  0.9 %  sodium chloride infusion (has no administration in time range)  aspirin suppository 300 mg (has no administration in time range)  iopamidol (ISOVUE-370) 76 % injection 100 mL (has no administration in time range)  etomidate (AMIDATE) injection 30 mg (30 mg Intravenous Given 06/06/18 0743)  succinylcholine (ANECTINE) injection 150 mg (150 mg Intravenous Given 06/06/18 0743)     ED Discharge Orders    None       Note:  This document was prepared using Dragon voice recognition software and may include unintentional dictation errors.    Loleta Rose, MD 06/06/18 6962    Loleta Rose, MD 06/06/18 [phone]

## 2018-06-06 NOTE — Consult Note (Signed)
Referring Physician: Delfino Lovett    Chief Complaint: Dizziness, nausea vomiting, slurred speech, unsteady gait, left facial numbness  HPI: Dillon Bradley is an 67 y.o. male with past medical history of Legg-Calve-Perthes, Psoriatic arthritis, hypertension, chronic back and neck pain, presenting to the ED with episodes of dizziness, unsteadiness nausea and vomiting since 06/05/2018.  Per patient's wife who provide history as patient is currently intubated, patient has had ear infection for the past few weeks and thought that his symptoms were due to this.  He developed an episode of severe dizziness at around noon yesterday  that seemed to be persistent as the day progressed. Patient described dizziness as room spinning sensation and feeling off balance.  Patient's wife states that he had difficulty walking and seemed to be swaying to the right side. She tried to help him walk however she was unable to so she helped lowered him to the floor because he could not maintain his balance or get him back up to the bed. Patient's symptoms got worse in the middle of the night with associated episode of nausea and vomiting, slurred speech with difficulty swallowing, and left facial numbness.  Per wife he was also having trouble finding words and was slow to respond. Denies associated altered sensorium, cranial nerve deficit, seizures, diplopia, headache, syncope or LOC.  Patient's wife stated initially he refused to come to the ED however as his symptoms got worse  she decided to call EMS.  On arrival to the ED patient was noted to have difficulty with speech and word finding, he was unable to clear his throat or control his secretions therefore was intubated for airway protection.  Code stroke initiated and patient was evaluated by tele-neurologist.  Initial CT head did not show any acute finding. Labs revealed Sodium 138, potassium 4, BUN 16, creatinine 0.73, blood glucose 138, WBC 16, hemoglobin 15, platelets 386.  Initial  NIH stroke scale3. Patient was not deemed a candidate for tPA due to being outside the window period and unclear etiology of his symptoms. He further imaging with CTP/CTA head and neck which did not show large vessel occlusion. He is being admitted to the ICU for further stroke work up and management.  Date last known well: Date: 06/05/2018 Time last known well: Time: 12:00 tPA Given: No, outside time window  Past Medical History:  Diagnosis Date  . Arthritis   . Chronic pain   . Hypertension   . Legg-Calve-Perthes disease   . Psoriasis     Past Surgical History:  Procedure Laterality Date  . NOSE SURGERY      Family History  Problem Relation Age of Onset  . Stroke Mother   . Hypertension Mother   . Kidney cancer Father   . Colon cancer Father   . Hypertension Father   . Ovarian cancer Sister   . Diabetes Brother   . Arthritis Brother    Social History:  reports that he has been smoking cigars. He has never used smokeless tobacco. He reports that he drinks about 4.0 standard drinks of alcohol per week. He reports that he does not use drugs.  Allergies:  Allergies  Allergen Reactions  . Aspirin     Pt overdosed on ASA fifty years ago.    Medications: I have reviewed the patient's current medications. Prior to Admission:  Medications Prior to Admission  Medication Sig Dispense Refill Last Dose  . acetaminophen (TYLENOL) 325 MG tablet Take 650 mg by mouth every 6 (six) hours as  needed.   06/05/2018 at 0800  . calcium-vitamin D (OSCAL WITH D) 500-200 MG-UNIT tablet Take 1 tablet by mouth daily.   06/05/2018 at 0800  . Coenzyme Q10 (COQ10) 100 MG CAPS Take 1 capsule by mouth daily.   06/05/2018 at 0800  . diclofenac (VOLTAREN) 75 MG EC tablet Take 75 mg by mouth 2 (two) times daily.   06/05/2018 at 0800  . Multiple Vitamins-Minerals (MULTIVITAMIN ADULTS 50+) TABS Take 1 tablet by mouth daily.   06/05/2018 at 0800   Prior to Admission medications   Medication Sig Start  Date End Date Taking? Authorizing Provider  acetaminophen (TYLENOL) 325 MG tablet Take 650 mg by mouth every 6 (six) hours as needed.   Yes [provider]  calcium-vitamin D (OSCAL WITH D) 500-200 MG-UNIT tablet Take 1 tablet by mouth daily.   Yes [provider]  Coenzyme Q10 (COQ10) 100 MG CAPS Take 1 capsule by mouth daily.   Yes [provider]  diclofenac (VOLTAREN) 75 MG EC tablet Take 75 mg by mouth 2 (two) times daily.   Yes [provider]  Multiple Vitamins-Minerals (MULTIVITAMIN ADULTS 50+) TABS Take 1 tablet by mouth daily.   Yes [provider]    ROS: Patient is currently intubated unable to obtain history  Physical Examination: Blood pressure 107/72, pulse (!) 55, temperature 98.4 F (36.9 C), temperature source Oral, resp. rate (!) 22, height 5\' 8"  (1.727 m), weight 104.3 kg, SpO2 99 %.   HEENT-  Normocephalic, no lesions, without obvious abnormality.  Normal external eye and conjunctiva.  Normal TM's bilaterally.  Normal auditory canals and external ears. Normal external nose, mucus membranes and septum.  Normal pharynx. Cardiovascular- S1, S2 normal, pulses palpable throughout   Lungs- chest clear, no wheezing, rales, normal symmetric air entry Abdomen- soft, non-tender; bowel sounds normal; no masses,  no organomegaly Extremities- no edema Lymph-no adenopathy palpable Musculoskeletal-no joint tenderness, deformity or swelling Skin-warm and dry, no hyperpigmentation, vitiligo, or suspicious lesions  Neurological Exam   Mental Status: Alert, intubated and sedated. Nods appropriately to orientation question, person, date, time, place and situation.oriented.  Unable to assess speech.  Able to follow 3 step commands without difficulty. Attention span and concentration seemed appropriate  Cranial Nerves: II: Discs flat bilaterally; Visual fields grossly normal, pupils equal, round, reactive to light and accommodation III,IV, VI:  ptosis not present, extra-ocular motions intact bilaterally V,VII: Unable to assess facial assymetry, facial light touch sensation decreased on the left VIII: hearing normal bilaterally IX,X: gag reflex present XI: bilateral shoulder shrug XII: midline tongue extension Motor: Right :  Upper extremity   5/5 Without pronator drift      Left: Upper extremity   5/5 without pronator drift Right:   Lower extremity   5/5                                          Left: Lower extremity   5/5 Tone and bulk:normal tone throughout; no atrophy noted Sensory: Pinprick and light touch intact bilaterally Deep Tendon Reflexes: 2+ and symmetric throughout Plantars: Right: upgoing                             Left: mute Cerebellar: Finger-to-nose testing intact bilaterally. Heel to shin testing normal bilaterally Gait: not tested due to safety concerns  Data Reviewed  Laboratory Studies:  Basic Metabolic Panel: Recent Labs  Lab 06/06/18 0634  NA 138  K 4.0  CL 101  CO2 26  GLUCOSE 138*  BUN 16  CREATININE 0.73  CALCIUM 9.7    Liver Function Tests: Recent Labs  Lab 06/06/18 0634  AST 26  ALT 29  ALKPHOS 77  BILITOT 0.8  PROT 8.1  ALBUMIN 4.2   No results for input(s): LIPASE, AMYLASE in the last 168 hours. No results for input(s): AMMONIA in the last 168 hours.  CBC: Recent Labs  Lab 06/06/18 0634  WBC 16.0*  NEUTROABS 13.7*  HGB 15.0  HCT 45.2  MCV 90.8  PLT 386    Cardiac Enzymes: No results for input(s): CKTOTAL, CKMB, CKMBINDEX, TROPONINI in the last 168 hours.  BNP: Invalid input(s): POCBNP  CBG: No results for input(s): GLUCAP in the last 168 hours.  Microbiology: No results found for this or any previous visit.  Coagulation Studies: Recent Labs    06/06/18 0634  LABPROT 12.4  INR 0.93    Urinalysis: No results for input(s): COLORURINE, LABSPEC, PHURINE, GLUCOSEU, HGBUR, BILIRUBINUR, KETONESUR, PROTEINUR, UROBILINOGEN, NITRITE, LEUKOCYTESUR in the last  168 hours.  Invalid input(s): APPERANCEUR  Lipid Panel: No results found for: CHOL, TRIG, HDL, CHOLHDL, VLDL, LDLCALC  HgbA1C: No results found for: HGBA1C  Urine Drug Screen:  No results found for: LABOPIA, COCAINSCRNUR, LABBENZ, AMPHETMU, THCU, LABBARB  Alcohol Level: No results for input(s): ETH in the last 168 hours.  Other results: EKG: normal EKG, normal sinus rhythm at 94 bpm.  Imaging: Ct Angio Head W Or Wo Contrast  Result Date: 06/06/2018 CLINICAL DATA:  Dizziness.  Vomiting.  Stroke. EXAM: CT ANGIOGRAPHY HEAD AND NECK CT PERFUSION BRAIN TECHNIQUE: Multidetector CT imaging of the head and neck was performed using the standard protocol during bolus administration of intravenous contrast. Multiplanar CT image reconstructions and MIPs were obtained to evaluate the vascular anatomy. Carotid stenosis measurements (when applicable) are obtained utilizing NASCET criteria, using the distal internal carotid diameter as the denominator. Multiphase CT imaging of the brain was performed following IV bolus contrast injection. Subsequent parametric perfusion maps were calculated using RAPID software. CONTRAST:  100mL ISOVUE-370 IOPAMIDOL (ISOVUE-370) INJECTION 76% COMPARISON:  CT head 06/06/2018 FINDINGS: CTA NECK FINDINGS Aortic arch: Standard branching. Imaged portion shows no evidence of aneurysm or dissection. No significant stenosis of the major arch vessel origins. Right carotid system: Right carotid widely patent without significant stenosis or atherosclerotic disease Left carotid system: Left carotid widely patent. Mild atherosclerotic disease left carotid bifurcation. Vertebral arteries: Left vertebral artery dominant. Both vertebral arteries are patent in the neck. Distal left vertebral artery stenosis as described below. Skeleton: Cervical spine degenerative change. No acute skeletal abnormality. Other neck: The patient is intubated. Endotracheal tube in satisfactory position. No soft  tissue mass. Upper chest: Mild atelectasis in the lungs bilaterally. Considerable left coronary calcification. Review of the MIP images confirms the above findings CTA HEAD FINDINGS Anterior circulation: Atherosclerotic calcification cavernous carotid bilaterally with mild stenosis bilaterally. Anterior and middle cerebral arteries patent bilaterally without stenosis. Posterior circulation: Circumferential calcific moderate stenosis of the distal left vertebral artery. Right vertebral artery is diminutive but patent to the basilar. Basilar is widely patent. PICA patent bilaterally. Superior cerebellar and posterior cerebral arteries patent bilaterally without stenosis or occlusion. Venous sinuses: Patent Anatomic variants: None Delayed phase: Not performed. Review of the MIP images confirms the above findings CT Brain Perfusion Findings: CBF (<30%) Volume: 0mL Perfusion (Tmax>6.0s) volume: 4mL Mismatch  Volume: 4mL Infarction Location:Left anterior frontal lobe.  Possible artifact. IMPRESSION: 1. Negative for emergent large vessel occlusion 2. No significant carotid or vertebral artery stenosis in the neck. Moderate stenosis distal left vertebral artery due to calcific stenosis. Basilar widely patent. 3. 4 mL of delayed perfusion left anterior frontal lobe, probable artifact of rapid software Electronically Signed   By: Marlan Palau M.D.   On: 06/06/2018 08:55   Ct Angio Neck W Or Wo Contrast  Result Date: 06/06/2018 CLINICAL DATA:  Dizziness.  Vomiting.  Stroke. EXAM: CT ANGIOGRAPHY HEAD AND NECK CT PERFUSION BRAIN TECHNIQUE: Multidetector CT imaging of the head and neck was performed using the standard protocol during bolus administration of intravenous contrast. Multiplanar CT image reconstructions and MIPs were obtained to evaluate the vascular anatomy. Carotid stenosis measurements (when applicable) are obtained utilizing NASCET criteria, using the distal internal carotid diameter as the denominator.  Multiphase CT imaging of the brain was performed following IV bolus contrast injection. Subsequent parametric perfusion maps were calculated using RAPID software. CONTRAST:  ISOVUE-370 IOPAMIDOL (ISOVUE-370) INJECTION 76% COMPARISON:  CT head 06/06/2018 FINDINGS: CTA NECK FINDINGS Aortic arch: Standard branching. Imaged portion shows no evidence of aneurysm or dissection. No significant stenosis of the major arch vessel origins. Right carotid system: Right carotid widely patent without significant stenosis or atherosclerotic disease Left carotid system: Left carotid widely patent. Mild atherosclerotic disease left carotid bifurcation. Vertebral arteries: Left vertebral artery dominant. Both vertebral arteries are patent in the neck. Distal left vertebral artery stenosis as described below. Skeleton: Cervical spine degenerative change. No acute skeletal abnormality. Other neck: The patient is intubated. Endotracheal tube in satisfactory position. No soft tissue mass. Upper chest: Mild atelectasis in the lungs bilaterally. Considerable left coronary calcification. Review of the MIP images confirms the above findings CTA HEAD FINDINGS Anterior circulation: Atherosclerotic calcification cavernous carotid bilaterally with mild stenosis bilaterally. Anterior and middle cerebral arteries patent bilaterally without stenosis. Posterior circulation: Circumferential calcific moderate stenosis of the distal left vertebral artery. Right vertebral artery is diminutive but patent to the basilar. Basilar is widely patent. PICA patent bilaterally. Superior cerebellar and posterior cerebral arteries patent bilaterally without stenosis or occlusion. Venous sinuses: Patent Anatomic variants: None Delayed phase: Not performed. Review of the MIP images confirms the above findings CT Brain Perfusion Findings: CBF (<30%) Volume: 0mL Perfusion (Tmax>6.0s) volume: 4mL Mismatch Volume: 4mL Infarction Location:Left anterior frontal lobe.   Possible artifact. IMPRESSION: 1. Negative for emergent large vessel occlusion 2. No significant carotid or vertebral artery stenosis in the neck. Moderate stenosis distal left vertebral artery due to calcific stenosis. Basilar widely patent. 3. 4 mL of delayed perfusion left anterior frontal lobe, probable artifact of rapid software Electronically Signed   By: Marlan Palau M.D.   On: 06/06/2018 08:55   Ct Cerebral Perfusion W Contrast  Result Date: 06/06/2018 CLINICAL DATA:  Dizziness.  Vomiting.  Stroke. EXAM: CT ANGIOGRAPHY HEAD AND NECK CT PERFUSION BRAIN TECHNIQUE: Multidetector CT imaging of the head and neck was performed using the standard protocol during bolus administration of intravenous contrast. Multiplanar CT image reconstructions and MIPs were obtained to evaluate the vascular anatomy. Carotid stenosis measurements (when applicable) are obtained utilizing NASCET criteria, using the distal internal carotid diameter as the denominator. Multiphase CT imaging of the brain was performed following IV bolus contrast injection. Subsequent parametric perfusion maps were calculated using RAPID software. CONTRAST:  ISOVUE-370 IOPAMIDOL (ISOVUE-370) INJECTION 76% COMPARISON:  CT head 06/06/2018 FINDINGS: CTA NECK FINDINGS Aortic arch: Standard  branching. Imaged portion shows no evidence of aneurysm or dissection. No significant stenosis of the major arch vessel origins. Right carotid system: Right carotid widely patent without significant stenosis or atherosclerotic disease Left carotid system: Left carotid widely patent. Mild atherosclerotic disease left carotid bifurcation. Vertebral arteries: Left vertebral artery dominant. Both vertebral arteries are patent in the neck. Distal left vertebral artery stenosis as described below. Skeleton: Cervical spine degenerative change. No acute skeletal abnormality. Other neck: The patient is intubated. Endotracheal tube in satisfactory position. No soft  tissue mass. Upper chest: Mild atelectasis in the lungs bilaterally. Considerable left coronary calcification. Review of the MIP images confirms the above findings CTA HEAD FINDINGS Anterior circulation: Atherosclerotic calcification cavernous carotid bilaterally with mild stenosis bilaterally. Anterior and middle cerebral arteries patent bilaterally without stenosis. Posterior circulation: Circumferential calcific moderate stenosis of the distal left vertebral artery. Right vertebral artery is diminutive but patent to the basilar. Basilar is widely patent. PICA patent bilaterally. Superior cerebellar and posterior cerebral arteries patent bilaterally without stenosis or occlusion. Venous sinuses: Patent Anatomic variants: None Delayed phase: Not performed. Review of the MIP images confirms the above findings CT Brain Perfusion Findings: CBF (<30%) Volume: 0mL Perfusion (Tmax>6.0s) volume: 4mL Mismatch Volume: 4mL Infarction Location:Left anterior frontal lobe.  Possible artifact. IMPRESSION: 1. Negative for emergent large vessel occlusion 2. No significant carotid or vertebral artery stenosis in the neck. Moderate stenosis distal left vertebral artery due to calcific stenosis. Basilar widely patent. 3. 4 mL of delayed perfusion left anterior frontal lobe, probable artifact of rapid software Electronically Signed   By: Marlan Palau M.D.   On: 06/06/2018 08:55   Dg Chest Portable 1 View  Result Date: 06/06/2018 CLINICAL DATA:  High hypoxia EXAM: PORTABLE CHEST 1 VIEW COMPARISON:  None. FINDINGS: Endotracheal tube tip is 2.2 cm above the carina. No pneumothorax. There is atelectatic change in the left base. There is no edema or consolidation. Heart is upper normal in size with pulmonary vascularity normal. No adenopathy. No bone lesions. IMPRESSION: Endotracheal tube as described without evident pneumothorax. Mild left base atelectasis. No edema or consolidation. Heart size within normal limits. Electronically  Signed   By: Bretta Bang III M.D.   On: 06/06/2018 08:43   Ct Head Code Stroke Wo Contrast  Result Date: 06/06/2018 CLINICAL DATA:  Code stroke. Dizziness. Altered level of consciousness. Abnormal speech. EXAM: CT HEAD WITHOUT CONTRAST TECHNIQUE: Contiguous axial images were obtained from the base of the skull through the vertex without intravenous contrast. COMPARISON:  None. FINDINGS: Brain: Mild atrophy. Chronic microvascular ischemic changes in the white matter. Multiple chronic infarcts in the basal ganglia and pons. Negative for hydrocephalus. Negative for acute infarct, hemorrhage, or mass lesion. Vascular: Atherosclerotic calcification. Negative for hyperdense vessel Skull: Negative Sinuses/Orbits: Mild mucosal edema paranasal sinuses.  Normal orbit Other: None ASPECTS (Alberta Stroke Program Early CT Score) - Ganglionic level infarction (caudate, lentiform nuclei, internal capsule, insula, M1-M3 cortex): 7 - Supraganglionic infarction (M4-M6 cortex): 3 Total score (0-10 with 10 being normal): 10 IMPRESSION: 1. No acute intracranial abnormality 2. Atrophy and moderate chronic microvascular ischemia 3. ASPECTS is 10 4. These results were called by telephone at the time of interpretation on 06/06/2018 at 6:57 am to Dr. Loleta Rose , who verbally acknowledged these results. Electronically Signed   By: Marlan Palau M.D.   On: 06/06/2018 06:58   Patient seen and examined.  Clinical course and management discussed.  Necessary edits performed.  I agree with the above.  Assessment  and plan of care developed and discussed below.    Assessment: 67 y.o. male with past medical history of Legg-Calve-Perthes, psoriatic arthritis, hypertension, chronic back and neck pain, presenting with episodes of dizziness, unsteadiness nausea and vomiting, slurred speech, difficulty swallowing and left facial numbness.  Posterior circulation acute infarct suspected.  Patient on no antiplatelet or anticoagulation  therapy prior to admission.  Head CT reviewed and shows no acute changes.  CTA shows no evidence of LVO or significant carotid, vertebral or basilar artery stenosis.  Further work up recommended.    Stroke Risk Factors - hypertension and smoking  Plan: 1. HgbA1c, fasting lipid panel 2. MRI of the brain without contrast 3. PT consult, OT consult, Speech consult 4. Echocardiogram 5. Prophylactic therapy-Antiplatelet med: Aspirin - dose 300 mg rectally qd 6. NPO until RN stroke swallow screen 7. Telemetry monitoring 8. Frequent neuro checks  This patient was staffed with Dr. Verlon Au, Thad Ranger who personally evaluated patient, reviewed documentation and agreed with assessment and plan of care as above.  Webb Silversmith, DNP, FNP-BC Board certified Nurse Practitioner Neurology Department  06/06/2018, 9:57 AM  Thana Farr, MD Neurology [phone]  06/06/2018  1:03 PM

## 2018-06-07 ENCOUNTER — Inpatient Hospital Stay: Payer: Medicare Other

## 2018-06-07 ENCOUNTER — Inpatient Hospital Stay (HOSPITAL_COMMUNITY)
Admit: 2018-06-07 | Discharge: 2018-06-07 | Disposition: A | Discharge status: Home / Self Care | Payer: Medicare Other | Attending: Internal Medicine | Admitting: Internal Medicine

## 2018-06-07 ENCOUNTER — Inpatient Hospital Stay: Payer: Medicare Other | Admitting: Anesthesiology

## 2018-06-07 DIAGNOSIS — I340 Nonrheumatic mitral (valve) insufficiency: Secondary | ICD-10-CM

## 2018-06-07 DIAGNOSIS — I639 Cerebral infarction, unspecified: Secondary | ICD-10-CM

## 2018-06-07 LAB — BASIC METABOLIC PANEL
Anion gap: 7 (ref 5–15)
BUN: 16 mg/dL (ref 8–23)
CHLORIDE: 105 mmol/L (ref 98–111)
CO2: 26 mmol/L (ref 22–32)
CREATININE: 0.84 mg/dL (ref 0.61–1.24)
Calcium: 8.5 mg/dL — ABNORMAL LOW (ref 8.9–10.3)
GFR calc non Af Amer: >60 mL/min (ref >60)
Glucose, Bld: 111 mg/dL — ABNORMAL HIGH (ref 70–99)
POTASSIUM: 3.9 mmol/L (ref 3.5–5.1)
SODIUM: 138 mmol/L (ref 135–145)

## 2018-06-07 LAB — CBC
HEMATOCRIT: 40.9 % (ref 39.0–52.0)
HEMOGLOBIN: 13 g/dL (ref 13.0–17.0)
MCH: 30 pg (ref 26.0–34.0)
MCHC: 31.8 g/dL (ref 30.0–36.0)
MCV: 94.2 fL (ref 80.0–100.0)
Platelets: 342 10*3/uL (ref 150–400)
RBC: 4.34 MIL/uL (ref 4.22–5.81)
RDW: 13.9 % (ref 11.5–15.5)
WBC: 12 10*3/uL — AB (ref 4.0–10.5)
nRBC: 0 % (ref 0.0–0.2)

## 2018-06-07 LAB — BLOOD GAS, ARTERIAL
ACID-BASE EXCESS: 1.5 mmol/L (ref 0.0–2.0)
BICARBONATE: 27.2 mmol/L (ref 20.0–28.0)
FIO2: 0.6
MECHVT: 550 mL
Mechanical Rate: 16
O2 SAT: 96.7 %
PCO2 ART: 46 mmHg (ref 32.0–48.0)
PEEP: 5 cmH2O
PH ART: 7.38 (ref 7.350–7.450)
Patient temperature: 37
pO2, Arterial: 91 mmHg (ref 83.0–108.0)

## 2018-06-07 LAB — LIPID PANEL
Cholesterol: 168 mg/dL (ref 0–200)
HDL: 48 mg/dL (ref >40)
LDL CALC: 97 mg/dL (ref 0–99)
TRIGLYCERIDES: 116 mg/dL (ref <150)
Total CHOL/HDL Ratio: 3.5 RATIO
VLDL: 23 mg/dL (ref 0–40)

## 2018-06-07 LAB — ECHOCARDIOGRAM COMPLETE
Height: 68 in
Weight: 3481.5 oz

## 2018-06-07 LAB — HEMOGLOBIN A1C
HEMOGLOBIN A1C: 5.4 % (ref 4.8–5.6)
MEAN PLASMA GLUCOSE: 108.28 mg/dL

## 2018-06-07 LAB — HIV ANTIBODY (ROUTINE TESTING W REFLEX): HIV Screen 4th Generation wRfx: NONREACTIVE

## 2018-06-07 MED ORDER — FAMOTIDINE 20 MG PO TABS
20.0000 mg | ORAL_TABLET | Freq: Every day | ORAL | Status: DC
  Administered 2018-06-07: 20 mg
  Filled 2018-06-07: qty 1

## 2018-06-07 MED ORDER — FENTANYL CITRATE (PF) 100 MCG/2ML IJ SOLN
25.0000 ug | INTRAMUSCULAR | Status: DC | PRN
  Administered 2018-06-07: 25 ug via INTRAVENOUS
  Filled 2018-06-07: qty 2

## 2018-06-07 MED ORDER — MIDAZOLAM HCL 2 MG/2ML IJ SOLN
INTRAMUSCULAR | Status: AC
  Administered 2018-06-07: 4 mg via INTRAVENOUS
  Filled 2018-06-07: qty 4

## 2018-06-07 MED ORDER — MIDAZOLAM HCL 2 MG/2ML IJ SOLN
1.0000 mg | INTRAMUSCULAR | Status: DC | PRN
  Administered 2018-06-08: 1 mg via INTRAVENOUS
  Filled 2018-06-07 (×2): qty 2

## 2018-06-07 MED ORDER — FENTANYL CITRATE (PF) 100 MCG/2ML IJ SOLN
100.0000 ug | Freq: Once | INTRAMUSCULAR | Status: AC
  Administered 2018-06-07: 100 ug via INTRAVENOUS

## 2018-06-07 MED ORDER — FENTANYL 2500MCG IN NS 250ML (10MCG/ML) PREMIX INFUSION
25.0000 ug/h | INTRAVENOUS | Status: DC
  Administered 2018-06-07: 150 ug/h via INTRAVENOUS
  Administered 2018-06-08 (×2): 300 ug/h via INTRAVENOUS
  Filled 2018-06-07 (×2): qty 250

## 2018-06-07 MED ORDER — FENTANYL CITRATE (PF) 100 MCG/2ML IJ SOLN: 50.0000 ug | Freq: Once | INTRAMUSCULAR | Status: DC

## 2018-06-07 MED ORDER — ETOMIDATE 2 MG/ML IV SOLN
INTRAVENOUS | Status: AC
  Administered 2018-06-07: 20 mg via INTRAVENOUS
  Filled 2018-06-07: qty 10

## 2018-06-07 MED ORDER — FAMOTIDINE IN NACL 20-0.9 MG/50ML-% IV SOLN
20.0000 mg | Freq: Two times a day (BID) | INTRAVENOUS | Status: DC
  Administered 2018-06-08 – 2018-06-10 (×6): 20 mg via INTRAVENOUS
  Filled 2018-06-07 (×6): qty 50

## 2018-06-07 MED ORDER — MIDAZOLAM HCL 2 MG/2ML IJ SOLN
1.0000 mg | INTRAMUSCULAR | Status: DC | PRN
  Filled 2018-06-07: qty 2

## 2018-06-07 MED ORDER — FENTANYL CITRATE (PF) 100 MCG/2ML IJ SOLN
INTRAMUSCULAR | Status: AC
  Administered 2018-06-07: 50 ug via INTRAVENOUS
  Filled 2018-06-07: qty 2

## 2018-06-07 MED ORDER — SUCCINYLCHOLINE CHLORIDE 20 MG/ML IJ SOLN
50.0000 mg | Freq: Once | INTRAMUSCULAR | Status: AC
  Administered 2018-06-07: 50 mg via INTRAVENOUS

## 2018-06-07 MED ORDER — SUCCINYLCHOLINE CHLORIDE 20 MG/ML IJ SOLN
INTRAMUSCULAR | Status: AC
  Administered 2018-06-07: 50 mg via INTRAVENOUS
  Filled 2018-06-07: qty 1

## 2018-06-07 MED ORDER — FENTANYL CITRATE (PF) 100 MCG/2ML IJ SOLN
INTRAMUSCULAR | Status: AC
  Administered 2018-06-07: 100 ug via INTRAVENOUS
  Filled 2018-06-07: qty 2

## 2018-06-07 MED ORDER — ASPIRIN 300 MG RE SUPP
300.0000 mg | Freq: Every day | RECTAL | Status: DC
  Administered 2018-06-07 – 2018-06-10 (×4): 300 mg via RECTAL
  Filled 2018-06-07 (×4): qty 1

## 2018-06-07 MED ORDER — MIDAZOLAM HCL 2 MG/2ML IJ SOLN
4.0000 mg | Freq: Once | INTRAMUSCULAR | Status: AC
  Administered 2018-06-07: 4 mg via INTRAVENOUS

## 2018-06-07 MED ORDER — ETOMIDATE 2 MG/ML IV SOLN
20.0000 mg | Freq: Once | INTRAVENOUS | Status: AC
  Administered 2018-06-07: 20 mg via INTRAVENOUS
  Filled 2018-06-07: qty 10

## 2018-06-07 MED ORDER — ETOMIDATE 2 MG/ML IV SOLN
20.0000 mg | Freq: Once | INTRAVENOUS | Status: AC
  Administered 2018-06-07: 20 mg via INTRAVENOUS

## 2018-06-07 MED ORDER — FENTANYL CITRATE (PF) 100 MCG/2ML IJ SOLN
50.0000 ug | Freq: Once | INTRAMUSCULAR | Status: AC
  Administered 2018-06-07: 50 ug via INTRAVENOUS

## 2018-06-07 MED ORDER — FENTANYL BOLUS VIA INFUSION
25.0000 ug | INTRAVENOUS | Status: DC | PRN
  Administered 2018-06-08: 25 ug via INTRAVENOUS
  Filled 2018-06-07: qty 25

## 2018-06-07 MED ORDER — DEXAMETHASONE SODIUM PHOSPHATE 10 MG/ML IJ SOLN
4.0000 mg | Freq: Three times a day (TID) | INTRAMUSCULAR | Status: AC
  Administered 2018-06-07 – 2018-06-08 (×3): 4 mg via INTRAVENOUS
  Filled 2018-06-07 (×3): qty 0.4

## 2018-06-07 NOTE — Progress Notes (Signed)
[facility] [place] [facility]   PATIENT NAME: Dillon Bradley    MR#:  [id number]  DATE OF BIRTH:  [birth year]  SUBJECTIVE:  CHIEF COMPLAINT:   Chief Complaint  Patient presents with  . Dizziness  on vent REVIEW OF SYSTEMS:  ROS: unable to obtain due to on vent DRUG ALLERGIES:   Allergies  Allergen Reactions  . Aspirin     Pt overdosed on ASA fifty years ago.   VITALS:  Blood pressure (!) 160/81, pulse 84, temperature 99.2 F (37.3 C), temperature source Oral, resp. rate (!) 25, height 5\' 8"  (1.727 m), weight 98.7 kg, SpO2 93 %. PHYSICAL EXAMINATION:  Physical Exam  HENT:  Head: Normocephalic and atraumatic.  ETT in place  Eyes: Pupils are equal, round, and reactive to light. Conjunctivae and EOM are normal.  Neck: Normal range of motion. Neck supple. No tracheal deviation present. No thyromegaly present.  Cardiovascular: Normal rate, regular rhythm and normal heart sounds.  Pulmonary/Chest: Effort normal and breath sounds normal. No respiratory distress. He has no wheezes. He exhibits no tenderness.  Abdominal: Soft. Bowel sounds are normal. He exhibits no distension. There is no tenderness.  Musculoskeletal: Normal range of motion.  Neurological: No cranial nerve deficit.  On vent  Skin: Skin is warm and dry. No rash noted.  Psychiatric:  On vent   LABORATORY PANEL:  Male CBC Recent Labs  Lab 06/07/18 0430  WBC 12.0*  HGB 13.0  HCT 40.9  PLT 342   ------------------------------------------------------------------------------------------------------------------ Chemistries  Recent Labs  Lab 06/06/18 0634 06/07/18 0430  NA 138 138  K 4.0 3.9  CL 101 105  CO2 26 26  GLUCOSE 138* 111*  BUN 16 16  CREATININE 0.73 0.84  CALCIUM 9.7 8.5*  AST 26  --   ALT 29  --   ALKPHOS 77  --   BILITOT 0.8  --    RADIOLOGY:  Dg Chest Port 1 View  Result Date: 06/07/2018 CLINICAL DATA:  Intubation EXAM: PORTABLE CHEST 1 VIEW COMPARISON:   06/06/2018 FINDINGS: Endotracheal tube in good position. NG tube is been placed in the stomach. Decreased lung volume. Bibasilar atelectasis unchanged. No significant effusion or edema. IMPRESSION: Endotracheal tube in good position. Low lung volumes with bibasilar atelectasis unchanged. Electronically Signed   By: Marlan Palauharles  Clark M.D.   On: 06/07/2018 10:51   ASSESSMENT AND PLAN:  89 y m with acute stroke  * Acute Stroke - ASA + Statin - pending echo  * Acute respiratory failure intubated for airway protection with new onset he CVA - weaning per PCCM  *Aspiration pneumonia with right bibasilar airspace disease -Empiric Unasyn.  * Hypotension; transient while in ED - likely due to fentanyl given for intubation - monitor  * Hyperglycemia - monitor sugars      All the records are reviewed and case discussed with Care Management/Social Worker. Management plans discussed with the patient, nursing and they are in agreement.  CODE STATUS: Full Code  TOTAL TIME TAKING CARE OF THIS PATIENT: 15 minutes.   More than 50% of the time was spent in counseling/coordination of care: YES  POSSIBLE D/C IN 2-3 DAYS, DEPENDING ON CLINICAL CONDITION.   Delfino LovettVipul Carron Jaggi M.D on 06/07/2018 at 4:25 PM  Between 7am to 6pm - Pager - [phone]  After 6pm go to [email or web address] - Social research officer, governmentpassword [email or web address]  [facility]  Office  [phone]  CC: Primary care physician; Delia HeadyKurz, James E, MD  Note: This dictation was  prepared with Dragon dictation along with smaller phrase technology. Any transcriptional errors that result from this process are unintentional.

## 2018-06-07 NOTE — Progress Notes (Signed)
Subjective: Patient remains intubated. No new neurological changes.  Objective: Current vital signs: BP (!) 169/93   Pulse 73   Temp 99.2 F (37.3 C) (Oral)   Resp 18   Ht 5' 8"  (1.727 m)   Wt 98.7 kg   SpO2 98%   BMI 33.09 kg/m  Vital signs in last 24 hours: Temp:  [98.2 F (36.8 C)-99.2 F (37.3 C)] 99.2 F (37.3 C) (11/21 0800) Pulse Rate:  [50-76] 73 (11/21 1100) Resp:  [15-18] 18 (11/21 1100) BP: (84-169)/(63-105) 169/93 (11/21 1100) SpO2:  [94 %-100 %] 98 % (11/21 1100) FiO2 (%):  [30 %] 30 % (11/21 0800)  Intake/Output from previous day: 11/20 0701 - 11/21 0700 In: 3209.9 [I.V.:2259.9; IV Piggyback:950] Out: 1610 [Urine:1375] Intake/Output this shift: Total I/O In: 307.6 [I.V.:307.6] Out: -  Nutritional status:  Diet Order            Diet NPO time specified  Diet effective now             Neurologic Exam:  Mental Status: Alert, intubated and sedated. Nods appropriately to orientation question, person, date, time, place and situation.  Able to write questions. Unable to assess speech. Able to follow 3 step commands without difficulty. Attention span and concentration seemed appropriate  Cranial Nerves: II: Discs flat bilaterally; Visual fields grossly normal, pupils equal, round, reactive to light and accommodation III,IV, VI: ptosis not present, extra-ocular motions intact bilaterally V,VII: Unable to assess facial assymetry, facial light touch sensationdecreased on the left VIII: hearing normal bilaterally IX,X: gag reflex present XI: bilateral shoulder shrug XII: midline tongue extension Motor: 5/5 throughout Sensory: Pinprick and light touchintact bilaterally Deep Tendon Reflexes: 2+ and symmetric throughout Plantars: Right:upgoingLeft: mute Cerebellar: Finger-to-nosetesting intact bilaterally.Heel to shin testing normal bilaterally Gait: not tested due to intubation  Data Reviewed  Lab Results: Basic  Metabolic Panel: Recent Labs  Lab 06/06/18 0634 06/07/18 0430  NA 138 138  K 4.0 3.9  CL 101 105  CO2 26 26  GLUCOSE 138* 111*  BUN 16 16  CREATININE 0.73 0.84  CALCIUM 9.7 8.5*    Liver Function Tests: Recent Labs  Lab 06/06/18 0634  AST 26  ALT 29  ALKPHOS 77  BILITOT 0.8  PROT 8.1  ALBUMIN 4.2   No results for input(s): LIPASE, AMYLASE in the last 168 hours. No results for input(s): AMMONIA in the last 168 hours.  CBC: Recent Labs  Lab 06/06/18 0634 06/07/18 0430  WBC 16.0* 12.0*  NEUTROABS 13.7*  --   HGB 15.0 13.0  HCT 45.2 40.9  MCV 90.8 94.2  PLT 386 342    Cardiac Enzymes: No results for input(s): CKTOTAL, CKMB, CKMBINDEX, TROPONINI in the last 168 hours.  Lipid Panel: Recent Labs  Lab 06/07/18 0430  CHOL 168  TRIG 116  HDL 48  CHOLHDL 3.5  VLDL 23  LDLCALC 97    CBG: Recent Labs  Lab 06/06/18 1000  GLUCAP 132*    Microbiology: Results for orders placed or performed during the hospital encounter of 06/06/18  MRSA PCR Screening     Status: None   Collection Time: 06/06/18 10:17 AM  Result Value Ref Range Status   MRSA by PCR NEGATIVE NEGATIVE Final    Comment:        The GeneXpert MRSA Assay (FDA approved for NASAL specimens only), is one component of a comprehensive MRSA colonization surveillance program. It is not intended to diagnose MRSA infection nor to guide or monitor treatment for  MRSA infections. Performed at [facility], [address], [place], [place] [address]     Coagulation Studies: Recent Labs    06/06/18 5284  LABPROT 12.4  INR 0.93    Imaging: Ct Angio Head W Or Wo Contrast  Result Date: 06/06/2018 CLINICAL DATA:  Dizziness.  Vomiting.  Stroke. EXAM: CT ANGIOGRAPHY HEAD AND NECK CT PERFUSION BRAIN TECHNIQUE: Multidetector CT imaging of the head and neck was performed using the standard protocol during bolus administration of intravenous contrast. Multiplanar CT image reconstructions and  MIPs were obtained to evaluate the vascular anatomy. Carotid stenosis measurements (when applicable) are obtained utilizing NASCET criteria, using the distal internal carotid diameter as the denominator. Multiphase CT imaging of the brain was performed following IV bolus contrast injection. Subsequent parametric perfusion maps were calculated using RAPID software. CONTRAST:  118m ISOVUE-370 IOPAMIDOL (ISOVUE-370) INJECTION 76% COMPARISON:  CT head 06/06/2018 FINDINGS: CTA NECK FINDINGS Aortic arch: Standard branching. Imaged portion shows no evidence of aneurysm or dissection. No significant stenosis of the major arch vessel origins. Right carotid system: Right carotid widely patent without significant stenosis or atherosclerotic disease Left carotid system: Left carotid widely patent. Mild atherosclerotic disease left carotid bifurcation. Vertebral arteries: Left vertebral artery dominant. Both vertebral arteries are patent in the neck. Distal left vertebral artery stenosis as described below. Skeleton: Cervical spine degenerative change. No acute skeletal abnormality. Other neck: The patient is intubated. Endotracheal tube in satisfactory position. No soft tissue mass. Upper chest: Mild atelectasis in the lungs bilaterally. Considerable left coronary calcification. Review of the MIP images confirms the above findings CTA HEAD FINDINGS Anterior circulation: Atherosclerotic calcification cavernous carotid bilaterally with mild stenosis bilaterally. Anterior and middle cerebral arteries patent bilaterally without stenosis. Posterior circulation: Circumferential calcific moderate stenosis of the distal left vertebral artery. Right vertebral artery is diminutive but patent to the basilar. Basilar is widely patent. PICA patent bilaterally. Superior cerebellar and posterior cerebral arteries patent bilaterally without stenosis or occlusion. Venous sinuses: Patent Anatomic variants: None Delayed phase: Not performed.  Review of the MIP images confirms the above findings CT Brain Perfusion Findings: CBF (<30%) Volume: 043mPerfusion (Tmax>6.0s) volume: 59m47mismatch Volume: 59mL30mfarction Location:Left anterior frontal lobe.  Possible artifact. IMPRESSION: 1. Negative for emergent large vessel occlusion 2. No significant carotid or vertebral artery stenosis in the neck. Moderate stenosis distal left vertebral artery due to calcific stenosis. Basilar widely patent. 3. 4 mL of delayed perfusion left anterior frontal lobe, probable artifact of rapid software Electronically Signed   By: CharFranchot Gallo.   On: 06/06/2018 08:55   Ct Angio Neck W Or Wo Contrast  Result Date: 06/06/2018 CLINICAL DATA:  Dizziness.  Vomiting.  Stroke. EXAM: CT ANGIOGRAPHY HEAD AND NECK CT PERFUSION BRAIN TECHNIQUE: Multidetector CT imaging of the head and neck was performed using the standard protocol during bolus administration of intravenous contrast. Multiplanar CT image reconstructions and MIPs were obtained to evaluate the vascular anatomy. Carotid stenosis measurements (when applicable) are obtained utilizing NASCET criteria, using the distal internal carotid diameter as the denominator. Multiphase CT imaging of the brain was performed following IV bolus contrast injection. Subsequent parametric perfusion maps were calculated using RAPID software. CONTRAST:  100mL38mVUE-370 IOPAMIDOL (ISOVUE-370) INJECTION 76% COMPARISON:  CT head 06/06/2018 FINDINGS: CTA NECK FINDINGS Aortic arch: Standard branching. Imaged portion shows no evidence of aneurysm or dissection. No significant stenosis of the major arch vessel origins. Right carotid system: Right carotid widely patent without significant stenosis or atherosclerotic disease Left carotid  system: Left carotid widely patent. Mild atherosclerotic disease left carotid bifurcation. Vertebral arteries: Left vertebral artery dominant. Both vertebral arteries are patent in the neck. Distal left vertebral  artery stenosis as described below. Skeleton: Cervical spine degenerative change. No acute skeletal abnormality. Other neck: The patient is intubated. Endotracheal tube in satisfactory position. No soft tissue mass. Upper chest: Mild atelectasis in the lungs bilaterally. Considerable left coronary calcification. Review of the MIP images confirms the above findings CTA HEAD FINDINGS Anterior circulation: Atherosclerotic calcification cavernous carotid bilaterally with mild stenosis bilaterally. Anterior and middle cerebral arteries patent bilaterally without stenosis. Posterior circulation: Circumferential calcific moderate stenosis of the distal left vertebral artery. Right vertebral artery is diminutive but patent to the basilar. Basilar is widely patent. PICA patent bilaterally. Superior cerebellar and posterior cerebral arteries patent bilaterally without stenosis or occlusion. Venous sinuses: Patent Anatomic variants: None Delayed phase: Not performed. Review of the MIP images confirms the above findings CT Brain Perfusion Findings: CBF (<30%) Volume: 39m Perfusion (Tmax>6.0s) volume: 485mMismatch Volume: 84m45mnfarction Location:Left anterior frontal lobe.  Possible artifact. IMPRESSION: 1. Negative for emergent large vessel occlusion 2. No significant carotid or vertebral artery stenosis in the neck. Moderate stenosis distal left vertebral artery due to calcific stenosis. Basilar widely patent. 3. 4 mL of delayed perfusion left anterior frontal lobe, probable artifact of rapid software Electronically Signed   By: ChaFranchot GalloD.   On: 06/06/2018 08:55   Mr Brain Wo Contrast  Result Date: 06/06/2018 CLINICAL DATA:  Initial evaluation for acute onset severe dizziness. EXAM: MRI HEAD WITHOUT CONTRAST MRA HEAD WITHOUT CONTRAST TECHNIQUE: Multiplanar, multiecho pulse sequences of the brain and surrounding structures were obtained without intravenous contrast. Angiographic images of the head were obtained  using MRA technique without contrast. COMPARISON:  Comparison made with prior CT and CTA from earlier the same day. FINDINGS: MRI HEAD FINDINGS Brain: Diffuse prominence of the CSF containing spaces compatible generalized age-related cerebral atrophy. Patchy and confluent T2/FLAIR hyperintensity within the periventricular and deep white matter both cerebral hemispheres noted, nonspecific, but most likely related chronic microvascular ischemic changes. Changes related underlying demyelinating disease could conceivably be considered as well. Multiple superimposed remote lacunar infarcts present within the bilateral hemispheric cerebral white matter, basal ganglia, thalami, and pons. Confluent focus of restricted diffusion measuring 13 x 10 x 6 mm seen involving the lateral left medulla, consistent with acute ischemic infarct (series 5, image 12). No associated hemorrhage or mass effect. Few additional scattered mild foci of diffusion weighted signal abnormality involving the supratentorial cerebral white matter and right thalamus felt to be most consistent with T2 shine through. No other definite acute or subacute ischemic infarct. No encephalomalacia to suggest chronic cortical infarction. No foci of susceptibility artifact to suggest acute or chronic intracranial hemorrhage. No mass lesion, midline shift or mass effect. No hydrocephalus. No extra-axial fluid collection. Pituitary gland normal. Vascular: Major intracranial vascular flow voids maintained. Skull and upper cervical spine: Craniocervical junction normal. Bone marrow signal intensity normal. No scalp soft tissue abnormality. Sinuses/Orbits: Globes and orbital soft tissues within normal limits. Scattered mucosal thickening throughout the paranasal sinuses. No air-fluid level to suggest acute sinusitis. No mastoid effusion. Inner ear structures grossly normal. Other: None. MRA HEAD FINDINGS ANTERIOR CIRCULATION: Distal cervical segments of the internal  carotid arteries are widely patent with antegrade flow. Petrous, cavernous, and supraclinoid segments widely patent without stenosis. ICA termini well perfused and symmetric. A1 segments, anterior communicating artery common anterior cerebral arteries widely patent bilaterally. No  M1 stenosis or occlusion. No proximal M2 occlusion. Distal MCA branches well perfused and symmetric POSTERIOR CIRCULATION: Short-segment mild to moderate distal left V4 stenosis (series 9, image 8). Visualized vertebral arteries otherwise patent to the vertebrobasilar junction. Basilar mildly tortuous but widely patent to its distal aspect without stenosis. Superior cerebral arteries patent bilaterally. PCA supplied via the basilar. Fetal type origin of the right PCA supplied via a widely patent right posterior communicating artery. Visualized PCAs patent to their distal aspects without appreciable stenosis. Distal small vessel atheromatous irregularity noted throughout the intracranial circulation IMPRESSION: MRI HEAD IMPRESSION: 1. 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 2. Moderate cerebral white matter disease, most likely related to chronic microvascular ischemic disease. Superimposed multiple chronic lacunar infarcts seen involving the hemispheric cerebral white matter, deep gray nuclei, and pons. MRA HEAD IMPRESSION: 1. Negative intracranial MRA for large vessel occlusion. 2. Short-segment mild to moderate distal left V4 atheromatous stenosis. Visualized right vertebral artery and basilar artery widely patent. No other hemodynamically significant or correctable stenosis identified. 3. Distal small vessel atheromatous irregularity throughout the intracranial circulation. Electronically Signed   By: Jeannine Boga M.D.   On: 06/06/2018 15:45   Ct Cerebral Perfusion W Contrast  Result Date: 06/06/2018 CLINICAL DATA:  Dizziness.  Vomiting.  Stroke. EXAM: CT ANGIOGRAPHY HEAD AND NECK CT  PERFUSION BRAIN TECHNIQUE: Multidetector CT imaging of the head and neck was performed using the standard protocol during bolus administration of intravenous contrast. Multiplanar CT image reconstructions and MIPs were obtained to evaluate the vascular anatomy. Carotid stenosis measurements (when applicable) are obtained utilizing NASCET criteria, using the distal internal carotid diameter as the denominator. Multiphase CT imaging of the brain was performed following IV bolus contrast injection. Subsequent parametric perfusion maps were calculated using RAPID software. CONTRAST:  171m ISOVUE-370 IOPAMIDOL (ISOVUE-370) INJECTION 76% COMPARISON:  CT head 06/06/2018 FINDINGS: CTA NECK FINDINGS Aortic arch: Standard branching. Imaged portion shows no evidence of aneurysm or dissection. No significant stenosis of the major arch vessel origins. Right carotid system: Right carotid widely patent without significant stenosis or atherosclerotic disease Left carotid system: Left carotid widely patent. Mild atherosclerotic disease left carotid bifurcation. Vertebral arteries: Left vertebral artery dominant. Both vertebral arteries are patent in the neck. Distal left vertebral artery stenosis as described below. Skeleton: Cervical spine degenerative change. No acute skeletal abnormality. Other neck: The patient is intubated. Endotracheal tube in satisfactory position. No soft tissue mass. Upper chest: Mild atelectasis in the lungs bilaterally. Considerable left coronary calcification. Review of the MIP images confirms the above findings CTA HEAD FINDINGS Anterior circulation: Atherosclerotic calcification cavernous carotid bilaterally with mild stenosis bilaterally. Anterior and middle cerebral arteries patent bilaterally without stenosis. Posterior circulation: Circumferential calcific moderate stenosis of the distal left vertebral artery. Right vertebral artery is diminutive but patent to the basilar. Basilar is widely  patent. PICA patent bilaterally. Superior cerebellar and posterior cerebral arteries patent bilaterally without stenosis or occlusion. Venous sinuses: Patent Anatomic variants: None Delayed phase: Not performed. Review of the MIP images confirms the above findings CT Brain Perfusion Findings: CBF (<30%) Volume: 05mPerfusion (Tmax>6.0s) volume: 42m442mismatch Volume: 42mL42mfarction Location:Left anterior frontal lobe.  Possible artifact. IMPRESSION: 1. Negative for emergent large vessel occlusion 2. No significant carotid or vertebral artery stenosis in the neck. Moderate stenosis distal left vertebral artery due to calcific stenosis. Basilar widely patent. 3. 4 mL of delayed perfusion left anterior frontal lobe, probable artifact of rapid softResearch officer, political party  By: Franchot Gallo M.D.   On: 06/06/2018 08:55   Dg Chest Port 1 View  Result Date: 06/07/2018 CLINICAL DATA:  Intubation EXAM: PORTABLE CHEST 1 VIEW COMPARISON:  06/06/2018 FINDINGS: Endotracheal tube in good position. NG tube is been placed in the stomach. Decreased lung volume. Bibasilar atelectasis unchanged. No significant effusion or edema. IMPRESSION: Endotracheal tube in good position. Low lung volumes with bibasilar atelectasis unchanged. Electronically Signed   By: Franchot Gallo M.D.   On: 06/07/2018 10:51   Dg Chest Portable 1 View  Result Date: 06/06/2018 CLINICAL DATA:  High hypoxia EXAM: PORTABLE CHEST 1 VIEW COMPARISON:  None. FINDINGS: Endotracheal tube tip is 2.2 cm above the carina. No pneumothorax. There is atelectatic change in the left base. There is no edema or consolidation. Heart is upper normal in size with pulmonary vascularity normal. No adenopathy. No bone lesions. IMPRESSION: Endotracheal tube as described without evident pneumothorax. Mild left base atelectasis. No edema or consolidation. Heart size within normal limits. Electronically Signed   By: Lowella Grip III M.D.   On: 06/06/2018 08:43   Mr Jodene Nam  Head/brain AG Cm  Result Date: 06/06/2018 CLINICAL DATA:  Initial evaluation for acute onset severe dizziness. EXAM: MRI HEAD WITHOUT CONTRAST MRA HEAD WITHOUT CONTRAST TECHNIQUE: Multiplanar, multiecho pulse sequences of the brain and surrounding structures were obtained without intravenous contrast. Angiographic images of the head were obtained using MRA technique without contrast. COMPARISON:  Comparison made with prior CT and CTA from earlier the same day. FINDINGS: MRI HEAD FINDINGS Brain: Diffuse prominence of the CSF containing spaces compatible generalized age-related cerebral atrophy. Patchy and confluent T2/FLAIR hyperintensity within the periventricular and deep white matter both cerebral hemispheres noted, nonspecific, but most likely related chronic microvascular ischemic changes. Changes related underlying demyelinating disease could conceivably be considered as well. Multiple superimposed remote lacunar infarcts present within the bilateral hemispheric cerebral white matter, basal ganglia, thalami, and pons. Confluent focus of restricted diffusion measuring 13 x 10 x 6 mm seen involving the lateral left medulla, consistent with acute ischemic infarct (series 5, image 12). No associated hemorrhage or mass effect. Few additional scattered mild foci of diffusion weighted signal abnormality involving the supratentorial cerebral white matter and right thalamus felt to be most consistent with T2 shine through. No other definite acute or subacute ischemic infarct. No encephalomalacia to suggest chronic cortical infarction. No foci of susceptibility artifact to suggest acute or chronic intracranial hemorrhage. No mass lesion, midline shift or mass effect. No hydrocephalus. No extra-axial fluid collection. Pituitary gland normal. Vascular: Major intracranial vascular flow voids maintained. Skull and upper cervical spine: Craniocervical junction normal. Bone marrow signal intensity normal. No scalp soft  tissue abnormality. Sinuses/Orbits: Globes and orbital soft tissues within normal limits. Scattered mucosal thickening throughout the paranasal sinuses. No air-fluid level to suggest acute sinusitis. No mastoid effusion. Inner ear structures grossly normal. Other: None. MRA HEAD FINDINGS ANTERIOR CIRCULATION: Distal cervical segments of the internal carotid arteries are widely patent with antegrade flow. Petrous, cavernous, and supraclinoid segments widely patent without stenosis. ICA termini well perfused and symmetric. A1 segments, anterior communicating artery common anterior cerebral arteries widely patent bilaterally. No M1 stenosis or occlusion. No proximal M2 occlusion. Distal MCA branches well perfused and symmetric POSTERIOR CIRCULATION: Short-segment mild to moderate distal left V4 stenosis (series 9, image 8). Visualized vertebral arteries otherwise patent to the vertebrobasilar junction. Basilar mildly tortuous but widely patent to its distal aspect without stenosis. Superior cerebral arteries patent bilaterally. PCA supplied via the  basilar. Fetal type origin of the right PCA supplied via a widely patent right posterior communicating artery. Visualized PCAs patent to their distal aspects without appreciable stenosis. Distal small vessel atheromatous irregularity noted throughout the intracranial circulation IMPRESSION: MRI HEAD IMPRESSION: 1. 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 2. Moderate cerebral white matter disease, most likely related to chronic microvascular ischemic disease. Superimposed multiple chronic lacunar infarcts seen involving the hemispheric cerebral white matter, deep gray nuclei, and pons. MRA HEAD IMPRESSION: 1. Negative intracranial MRA for large vessel occlusion. 2. Short-segment mild to moderate distal left V4 atheromatous stenosis. Visualized right vertebral artery and basilar artery widely patent. No other hemodynamically significant or  correctable stenosis identified. 3. Distal small vessel atheromatous irregularity throughout the intracranial circulation. Electronically Signed   By: Jeannine Boga M.D.   On: 06/06/2018 15:45   Ct Head Code Stroke Wo Contrast  Result Date: 06/06/2018 CLINICAL DATA:  Code stroke. Dizziness. Altered level of consciousness. Abnormal speech. EXAM: CT HEAD WITHOUT CONTRAST TECHNIQUE: Contiguous axial images were obtained from the base of the skull through the vertex without intravenous contrast. COMPARISON:  None. FINDINGS: Brain: Mild atrophy. Chronic microvascular ischemic changes in the white matter. Multiple chronic infarcts in the basal ganglia and pons. Negative for hydrocephalus. Negative for acute infarct, hemorrhage, or mass lesion. Vascular: Atherosclerotic calcification. Negative for hyperdense vessel Skull: Negative Sinuses/Orbits: Mild mucosal edema paranasal sinuses.  Normal orbit Other: None ASPECTS ([place] Stroke Program Early CT Score) - Ganglionic level infarction (caudate, lentiform nuclei, internal capsule, insula, M1-M3 cortex): 7 - Supraganglionic infarction (M4-M6 cortex): 3 Total score (0-10 with 10 being normal): 10 IMPRESSION: 1. No acute intracranial abnormality 2. Atrophy and moderate chronic microvascular ischemia 3. ASPECTS is 10 4. These results were called by telephone at the time of interpretation on 06/06/2018 at 6:57 am to Dr. Hinda Kehr , who verbally acknowledged these results. Electronically Signed   By: Franchot Gallo M.D.   On: 06/06/2018 06:58    Medications:  I have reviewed the patient's current medications. Prior to Admission:  Medications Prior to Admission  Medication Sig Dispense Refill Last Dose  . acetaminophen (TYLENOL) 325 MG tablet Take 650 mg by mouth every 6 (six) hours as needed.   06/05/2018 at 0800  . calcium-vitamin D (OSCAL WITH D) 500-200 MG-UNIT tablet Take 1 tablet by mouth daily.   06/05/2018 at 0800  . Coenzyme Q10 (COQ10) 100 MG  CAPS Take 1 capsule by mouth daily.   06/05/2018 at 0800  . diclofenac (VOLTAREN) 75 MG EC tablet Take 75 mg by mouth 2 (two) times daily.   06/05/2018 at 0800  . Multiple Vitamins-Minerals (MULTIVITAMIN ADULTS 50+) TABS Take 1 tablet by mouth daily.   06/05/2018 at 0800   Scheduled: .  stroke: mapping our early stages of recovery book   Does not apply Once  . chlorhexidine gluconate (MEDLINE KIT)  15 mL Mouth Rinse BID  . famotidine  20 mg Per Tube Daily  . mouth rinse  15 mL Mouth Rinse 10 times per day   Patient seen and examined.  Clinical course and management discussed.  Necessary edits performed.  I agree with the above.  Assessment and plan of care developed and discussed below.    Assessment: 67 y.o. male with past medical history of Legg-Calve-Perthes, psoriatic arthritis, hypertension, chronic back and neck pain, presenting with episodes of vertigo, unsteadiness, nausea/vomiting, slurred speech, difficulty swallowing and left facial numbness. 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  No large vessel occlusion noted on MRA head. Remains intubated.  Started on Unasyn due to aspiration pneumonia and atelectasis. Hemoglobin A1c 5.4, LDL 97.  Plan: 1. Prophylactic therapy-Antiplatelet med: Aspirin - dose 300 mg rectally qd 2. PT/OT/Speech consult with speech to evaluate for swallowing once extubated 3. Echocardiogram pending 4. Telemetry monitoring 5. Frequent neuro checks 6. Statin to be initiated once able to take po with goal LDL<70 7. Smoking cessation counseling  This patient was staffed with Dr. Magda Paganini, Doy Mince who personally evaluated patient, reviewed documentation and agreed with assessment and plan of care as above.  Rufina Falco, DNP, FNP-BC Board certified Nurse Practitioner Neurology Department    LOS: 1 day   06/07/2018  11:51 AM  Alexis Goodell, MD Neurology [phone]  06/07/2018  2:40  PM

## 2018-06-07 NOTE — Progress Notes (Signed)
[  x]Manual[] Template[] Copied  Added by: [x] Jefm BryantKim, Jasmeen Fritsch T, RN  [] Hover for details Patient now extubated, O2 sats 96% on [place], RN notified Speech Therapist Santina EvansCatherine who states that she does not feel confident to eval patient today based on patient's history of swallowing problem.  Will probably see tomorrow. Son and patient updated.

## 2018-06-07 NOTE — Progress Notes (Signed)
OT Cancellation Note  Patient Details Name: Dillon Bradley MRN: [id number] DOB: [birth year]   Cancelled Treatment:    Reason Eval/Treat Not Completed: Patient not medically ready. Patient remains intubated/sedated.  Will complete order at this time; please re-consult OT as medically appropriate.  Richrd PrimeJamie Stiller, MPH, MS, OTR/L ascom [phone] 06/07/18, 8:06 AM

## 2018-06-07 NOTE — Progress Notes (Addendum)
SLP Cancellation Note  Patient Details Name: Dillon Bradley MRN: [id number] DOB: 06-21-1951   Cancelled treatment:       Reason Eval/Treat Not Completed: (chart reviewed). Pt remains orally intubated at this time. Noted MRI results indicating a 13 x 10 x 6 mm acute ischemic nonhemorrhagic left lateral medullary infarct. Pt is at risk for Dysphagia w/ aspiration w/ such presentation. Will await to f/u w/ pt once he is extubated. NSG/MD updated.     Jerilynn SomKatherine Watson, MS, CCC-SLP Watson,Katherine 06/07/2018, 10:26 AM

## 2018-06-07 NOTE — Progress Notes (Addendum)
SLP Cancellation Note  Patient Details Name: Dillon Bradley MRN: [id number] DOB: 1951/04/12   Cancelled treatment:       Reason Eval/Treat Not Completed: Patient not medically ready;Medical issues which prohibited therapy(chart reviewed; consulted NSG then met w/ pt/Son in room). Per MRI results, pt has a 13 x 10 x 6 mm acute ischemic nonhemorrhagic left lateral medullary infarct; Moderate cerebral white matter disease.Pt was extubated around Noon today. He continues to present w/ SIGNIFICANT laryngeal-pharyngeal secretions post extubation which he is not fully able to manage - he is unable to cough hard and hawk/spit to clear these secretions when given education/instruction to do so. Pt often keeps the [place] at his mouth just suctioning saliva. Instructed pt and Son (also NSG) to encourage pt to put more effort in coughing hard to clear the secretions and/or to swallow hard to clear the secretions for the evening. Spit cups were provided. Due to pt's MRI presentation (location of the infarct) and his current presentation unable to manage his own secretions, pt is at increased risk for Dysphagia and a BSE evaluation is not recommended today. Gave instructions to pt/Son to work on the above. Recommended RT to deep suction pt to aid in clearing/removing the deep secretions. Recommended pt sit more upright in bed vs lying reclined for better airway management. Will recommend an objective swallowing assessment for tomorrow if pt is able to participate safely. NP and NSG updated. Pt/Son agreed.     Orinda Kenner, MS, CCC-SLP Watson,Katherine 06/07/2018, 3:18 PM

## 2018-06-07 NOTE — Progress Notes (Signed)
PT Cancellation Note  Patient Details Name: Dillon Bradley MRN: [id number] DOB: 05-May-1951   Cancelled Treatment:    Reason Eval/Treat Not Completed: Medical issues which prohibited therapy(Patient remains intubated/sedated.  Will complete order at this time; please re-consult as medically appropriate.)   Lilygrace Rodick H. Manson PasseyBrown, PT, DPT, NCS 06/07/18, 8:04 AM [phone]

## 2018-06-07 NOTE — Progress Notes (Signed)
*  PRELIMINARY RESULTS* Echocardiogram 2D Echocardiogram has been performed.  Cristela BlueHege, Kniyah Khun 06/07/2018, 9:48 AM

## 2018-06-07 NOTE — Plan of Care (Signed)
Placed on High fowlers position, cuff deflated, suctioned orally and endotracheally and then extubated to 3 lpm o2 [place]

## 2018-06-07 NOTE — Progress Notes (Addendum)
Name: Colleen CanBryan Washington MRN: [id number] DOB: [birth year]     CONSULTATION DATE: 06/06/2018  Subjective & Objectives: On vent and no distress.  PAST MEDICAL HISTORY :   has a past medical history of Arthritis, Chronic pain, Hypertension, Legg-Calve-Perthes disease, and Psoriasis.  has a past surgical history that includes Nose surgery. Prior to Admission medications   Medication Sig Start Date End Date Taking? Authorizing Provider  acetaminophen (TYLENOL) 325 MG tablet Take 650 mg by mouth every 6 (six) hours as needed.   Yes [provider]  calcium-vitamin D (OSCAL WITH D) 500-200 MG-UNIT tablet Take 1 tablet by mouth daily.   Yes [provider]  Coenzyme Q10 (COQ10) 100 MG CAPS Take 1 capsule by mouth daily.   Yes [provider]  diclofenac (VOLTAREN) 75 MG EC tablet Take 75 mg by mouth 2 (two) times daily.   Yes [provider]  Multiple Vitamins-Minerals (MULTIVITAMIN ADULTS 50+) TABS Take 1 tablet by mouth daily.   Yes [provider]   Allergies  Allergen Reactions  . Aspirin     Pt overdosed on ASA fifty years ago.    FAMILY HISTORY:  family history includes Arthritis in his brother; Colon cancer in his father; Diabetes in his brother; Hypertension in his father and mother; Kidney cancer in his father; Ovarian cancer in his sister; Stroke in his mother. SOCIAL HISTORY:  reports that he has been smoking cigars. He has never used smokeless tobacco. He reports that he drinks about 4.0 standard drinks of alcohol per week. He reports that he does not use drugs.  REVIEW OF SYSTEMS:   Unable to obtain due to critical illness   VITAL SIGNS: Temp:  [98.2 F (36.8 C)-99.2 F (37.3 C)] 99.2 F (37.3 C) (11/21 0800) Pulse Rate:  [50-87] 87 (11/21 1400) Resp:  [15-23] 19 (11/21 1400) BP: (93-171)/(63-105) 142/96 (11/21 1400) SpO2:  [94 %-100 %] 96 % (11/21 1400) FiO2 (%):  [30 %] 30 % (11/21 0800)  Physical Examination:  Off  sedation, awake and following command.  Moving all extremities and detailed neuro exam as per neurology On a ventilator, no distress, bilateral equal air entry with no adventitious sounds S1 & S2 are audible with no murmur Benign abdominal exam with normal peristalsis No edema  ASSESSMENT / PLAN:  Acute respiratory failure intubated for airway protection with new onset he CVA.  Good gag reflex and tolerating SBT. 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  No large vessel occlusion with cerebral angiogram -Aspirin + statin -Monitor neuro status and management as per neurology  Aspiration pneumonia with right bibasilar airspace disease -Empiric Unasyn. Monitor CXR + CBC + FiO2  Hypertension -Optimize antihypertensives and monitor hemodynamics  Full code  DVT & GI prophylaxis. Continue with supportive care Family (wife and son) were updated at the bedside and they agreed to the plan of care Critical care time 45 min   20:20  Patient was extubated earlier.  He was noticed to have problem handling secretions, nasal trumpet was placed for frequent NT suction. Patient continued to pull secretions and hypopharynx, desaturation with increased FiO2 requirement.  Family was updated at the bedside (his wife and son) they agreed to reintubation and possible requirement to place a trach as the patient is unable to protect his airway at this point. Endotracheal intubation was on successively attempt twice using the kaleidoscope, noticed to have anterior larynx and unable to extend to the neck because of history  of arthritis of cervical spine. Anesthesia scope was called for help, Dr. Pernell Dupre and kindly arrived at the bedside and after 2 attempts he was able to place a bougie in advance of the ET tube with the Seldinger technique. Desaturation during the procedure to low as 88%, memory loss of the waveform and patient responded to bag mask ventilation back to 99-100%.   About 200 mL of saliva were suctioned from the hypopharynx during the procedure. Chest x-ray confirmed placement of ET tube, orogastric tube was coiled in the stomach and was repositioned. Will start on Decadron because of anticipated history edema due to manipulations. Patient started working up and were able to hold the eye contact with the family.

## 2018-06-07 NOTE — Consult Note (Signed)
Pharmacy Antibiotic Note  Dillon Bradley is a 67 y.o. male admitted on 06/06/2018 with stroke and possible aspiration pneumonia.  Pharmacy has been consulted for Unasyn dosing.  Plan: Continue Unasyn 3 gm IV every 6 hours  Height: 5\' 8"  (172.7 cm) Weight: 217 lb 9.5 oz (98.7 kg) IBW/kg (Calculated) : 68.4  Temp (24hrs), Avg:98.6 F (37 C), Min:98.2 F (36.8 C), Max:99.2 F (37.3 C)  Recent Labs  Lab 06/06/18 0634 06/07/18 0430  WBC 16.0* 12.0*  CREATININE 0.73 0.84    Estimated Creatinine Clearance: 95.8 mL/min (by C-G formula based on SCr of 0.84 mg/dL).    Allergies  Allergen Reactions  . Aspirin     Pt overdosed on ASA fifty years ago.    Antimicrobials this admission: Unasyn 11/20 >>   Microbiology results:  MRSA PCR: negative   Thank you for allowing pharmacy to be a part of this patient's care.  Gardner CandleSheema M Akshita Italiano, PharmD, BCPS Clinical Pharmacist 06/07/2018 8:41 AM

## 2018-06-07 NOTE — Anesthesia Procedure Notes (Signed)
Procedure Name: Intubation Date/Time: 06/07/2018 7:43 PM Performed by: Yevette EdwardsAdams,  G, MD Oxygen Delivery Method: Ambu bag Preoxygenation: Pre-oxygenation with 100% oxygen Induction Type: Cricoid Pressure applied Ventilation: Mask ventilation with difficulty Laryngoscope Size: Glidescope, 3 and 4 Laser Tube: Cuffed inflated with minimal occlusive pressure - saline Tube size: 7.5 mm Airway Equipment and Method: Stylet,  Bougie stylet and Oral airway Placement Confirmation: ETT inserted through vocal cords under direct vision,  breath sounds checked- equal and bilateral,  CO2 detector and positive ETCO2 Tube secured with: Tape Difficulty Due To: Difficulty was anticipated, Difficult Airway- due to reduced neck mobility and Difficult Airway- due to large tongue Comments: Called to assist with difficult intubation in Icu.  Multiple previous attempts with traumatic airway and glottis.  Repeat efforts with glide 3 and 4 revealsed poor visualization of larynx.  Unable to pass Bougie but able to successfully pass a manipulable bougie with 1 pass with indirect glide vision and then to seldinger an ett.  Pos colorimeter and etc02.  vsst and sats greater than 87% thoughout.  cxr pending.  JA

## 2018-06-07 NOTE — Progress Notes (Signed)
Dr Duanne LimerickSamaan at bedside, states "We are not going to extubate him today, he has no gag reflex, you can restart him on sedation.  Start fentanyl at 50mcg and see how it goes"  RN updated patient and family.

## 2018-06-07 NOTE — Progress Notes (Signed)
Speech Therapist, Santina Evansatherine at bedside, states "He will need a swallowing study tomorrow, take the suction away from him, he needs to be able to swallow his own oral secretion, you can continue to swab his mouth as needed."

## 2018-06-07 NOTE — Progress Notes (Signed)
Dr Duanne LimerickSamaan notified that patient continues to desat to 88% at times even with trumpet in place, gurgle sounds upper airway, after RT suctions sat went back up to mid  90s.  Patient also complain of arthritis pain at 8/10.  MD will place order.

## 2018-06-08 ENCOUNTER — Inpatient Hospital Stay: Payer: Self-pay

## 2018-06-08 ENCOUNTER — Inpatient Hospital Stay: Payer: Medicare Other

## 2018-06-08 DIAGNOSIS — I63431 Cerebral infarction due to embolism of right posterior cerebral artery: Secondary | ICD-10-CM

## 2018-06-08 LAB — BASIC METABOLIC PANEL
Anion gap: 8 (ref 5–15)
BUN: 14 mg/dL (ref 8–23)
CALCIUM: 8.4 mg/dL — AB (ref 8.9–10.3)
CHLORIDE: 105 mmol/L (ref 98–111)
CO2: 27 mmol/L (ref 22–32)
CREATININE: 0.81 mg/dL (ref 0.61–1.24)
GFR calc Af Amer: >60 mL/min (ref >60)
GFR calc non Af Amer: >60 mL/min (ref >60)
Glucose, Bld: 133 mg/dL — ABNORMAL HIGH (ref 70–99)
Potassium: 4.1 mmol/L (ref 3.5–5.1)
SODIUM: 140 mmol/L (ref 135–145)

## 2018-06-08 LAB — BLOOD GAS, ARTERIAL
Acid-Base Excess: 2 mmol/L (ref 0.0–2.0)
Bicarbonate: 26.5 mmol/L (ref 20.0–28.0)
FIO2: 0.6
LHR: 16 {breaths}/min
MECHVT: 550 mL
O2 SAT: 97.9 %
PATIENT TEMPERATURE: 37
PCO2 ART: 40 mmHg (ref 32.0–48.0)
PEEP: 5 cmH2O
PO2 ART: 131 mmHg — AB (ref 83.0–108.0)
pH, Arterial: 7.43 (ref 7.350–7.450)

## 2018-06-08 LAB — CBC WITH DIFFERENTIAL/PLATELET
Abs Immature Granulocytes: 0.11 10*3/uL — ABNORMAL HIGH (ref 0.00–0.07)
Basophils Absolute: 0 10*3/uL (ref 0.0–0.1)
Basophils Relative: 0 %
EOS PCT: 1 %
Eosinophils Absolute: 0.1 10*3/uL (ref 0.0–0.5)
HEMATOCRIT: 40.7 % (ref 39.0–52.0)
HEMOGLOBIN: 13.2 g/dL (ref 13.0–17.0)
Immature Granulocytes: 1 %
LYMPHS PCT: 3 %
Lymphs Abs: 0.5 10*3/uL — ABNORMAL LOW (ref 0.7–4.0)
MCH: 30.2 pg (ref 26.0–34.0)
MCHC: 32.4 g/dL (ref 30.0–36.0)
MCV: 93.1 fL (ref 80.0–100.0)
Monocytes Absolute: 1 10*3/uL (ref 0.1–1.0)
Monocytes Relative: 6 %
NRBC: 0 % (ref 0.0–0.2)
Neutro Abs: 14.9 10*3/uL — ABNORMAL HIGH (ref 1.7–7.7)
Neutrophils Relative %: 89 %
Platelets: 280 10*3/uL (ref 150–400)
RBC: 4.37 MIL/uL (ref 4.22–5.81)
RDW: 13.6 % (ref 11.5–15.5)
WBC: 16.7 10*3/uL — AB (ref 4.0–10.5)

## 2018-06-08 LAB — GLUCOSE, CAPILLARY: GLUCOSE-CAPILLARY: 140 mg/dL — AB (ref 70–99)

## 2018-06-08 MED ORDER — FENTANYL BOLUS VIA INFUSION
25.0000 ug | INTRAVENOUS | Status: DC | PRN
  Filled 2018-06-08: qty 50

## 2018-06-08 MED ORDER — FENTANYL CITRATE (PF) 100 MCG/2ML IJ SOLN: 25.0000 ug | INTRAMUSCULAR | Status: DC | PRN

## 2018-06-08 MED ORDER — MIDAZOLAM HCL 2 MG/2ML IJ SOLN
1.0000 mg | INTRAMUSCULAR | Status: DC | PRN
  Administered 2018-06-09: 2 mg via INTRAVENOUS
  Filled 2018-06-08 (×2): qty 2

## 2018-06-08 MED ORDER — FREE WATER
30.0000 mL | Status: DC
  Administered 2018-06-08 – 2018-06-10 (×13): 30 mL

## 2018-06-08 MED ORDER — PRO-STAT SUGAR FREE PO LIQD
60.0000 mL | Freq: Two times a day (BID) | ORAL | Status: DC
  Administered 2018-06-08 – 2018-06-10 (×5): 60 mL

## 2018-06-08 MED ORDER — ADULT MULTIVITAMIN LIQUID CH
15.0000 mL | Freq: Every day | ORAL | Status: DC
  Administered 2018-06-08 – 2018-06-10 (×3): 15 mL
  Filled 2018-06-08 (×4): qty 15

## 2018-06-08 MED ORDER — MIDAZOLAM HCL 2 MG/2ML IJ SOLN
1.0000 mg | INTRAMUSCULAR | Status: DC | PRN
  Administered 2018-06-09: 2 mg via INTRAVENOUS

## 2018-06-08 MED ORDER — MIDAZOLAM HCL 2 MG/2ML IJ SOLN
2.0000 mg | Freq: Once | INTRAMUSCULAR | Status: AC
  Administered 2018-06-08: 2 mg via INTRAVENOUS

## 2018-06-08 MED ORDER — VITAL HIGH PROTEIN PO LIQD
1000.0000 mL | ORAL | Status: DC
  Administered 2018-06-08: 1000 mL

## 2018-06-08 NOTE — Progress Notes (Signed)
Subjective: Patient was extubated yesterday, however he was noticed to have difficulty with handling secretions. Nasal trumpet was placed in an attempt to control secretions through NT suction but was unsuccessful due to continued secretions and desaturation. He was therefore re-intubated for airway protection. He remains alert and able to follow commands. He communicates through visual charts and writing. Patient's son and wife currently at bedside. They are very supportive and understanding of current situation. No concerns verbalized.  Objective: Current vital signs: BP (!) 156/93   Pulse 71   Temp 99.4 F (37.4 C) (Axillary)   Resp 17   Ht _0  (1.727 m)   Wt 98.7 kg   SpO2 99%   BMI 33.09 kg/m  Vital signs in last 24 hours: Temp:  [99.4 F (37.4 C)-101.2 F (38.4 C)] 99.4 F (37.4 C) (11/22 0742) Pulse Rate:  [55-96] 71 (11/22 0811) Resp:  [15-30] 17 (11/22 0811) BP: (104-171)/(38-96) 156/93 (11/22 0800) SpO2:  [88 %-100 %] 99 % (11/22 0811) FiO2 (%):  [50 %-60 %] 50 % (11/22 0754)  Intake/Output from previous day: 11/21 0701 - 11/22 0700 In: 2577.7 [I.V.:2027.7; IV Piggyback:550] Out: 2000 [Urine:2000] Intake/Output this shift: No intake/output data recorded. Nutritional status:  Diet Order            Diet NPO time specified  Diet effective now             Neurologic Exam:  Mental Status: Alert,intubated and sedated. Nods appropriately to orientation question, person, date, time, place and situation. Able to communicate through visual chart and writing. Unable to assess speech. Able to follow 3 step commands without difficulty. Attention span and concentration seemed appropriate  Cranial Nerves: II: Discs flat bilaterally; Visual fields grossly normal, pupils equal, round, reactive to light and accommodation III,IV, VI: ptosis not present, extra-ocular motions intact bilaterally V,VII:Unable to assess facial assymetry, facial light touch sensationdecreased  on the left VIII: hearing normal bilaterally IX,X: gag reflex present XI: bilateral shoulder shrug XII: midline tongue extension Motor: 5/5 throughout Sensory: Pinprick and light touchintact bilaterally Deep Tendon Reflexes: 2+ and symmetric throughout Plantars: Right:upgoingLeft: mute Cerebellar: Finger-to-nosetesting intact bilaterally.Heel to shin testing normal bilaterally Gait: not tested due to intubation  Lab Results: Basic Metabolic Panel: Recent Labs  Lab 06/06/18 0634 06/07/18 0430 06/08/18 0618  NA 138 138 140  K 4.0 3.9 4.1  CL 101 105 105  CO2 _1 GLUCOSE 138* 111* 133*  BUN _2 CREATININE 0.73 0.84 0.81  CALCIUM 9.7 8.5* 8.4*    Liver Function Tests: Recent Labs  Lab 06/06/18 0634  AST 26  ALT 29  ALKPHOS 77  BILITOT 0.8  PROT 8.1  ALBUMIN 4.2   No results for input(s): LIPASE, AMYLASE in the last 168 hours. No results for input(s): AMMONIA in the last 168 hours.  CBC: Recent Labs  Lab 06/06/18 0634 06/07/18 0430 06/08/18 0618  WBC 16.0* 12.0* 16.7*  NEUTROABS 13.7*  --  14.9*  HGB 15.0 13.0 13.2  HCT 45.2 40.9 40.7  MCV 90.8 94.2 93.1  PLT 386 342 280    Cardiac Enzymes: No results for input(s): CKTOTAL, CKMB, CKMBINDEX, TROPONINI in the last 168 hours.  Lipid Panel: Recent Labs  Lab 06/07/18 0430  CHOL 168  TRIG 116  HDL 48  CHOLHDL 3.5  VLDL 23  LDLCALC 97    CBG: Recent Labs  Lab 06/06/18 1000  GLUCAP 132*    Microbiology: Results for orders placed or performed  during the hospital encounter of 06/06/18  MRSA PCR Screening     Status: None   Collection Time: 06/06/18 10:17 AM  Result Value Ref Range Status   MRSA by PCR NEGATIVE NEGATIVE Final    Comment:        The GeneXpert MRSA Assay (FDA approved for NASAL specimens only), is one component of a comprehensive MRSA colonization surveillance program. It is not intended to diagnose MRSA infection nor to guide  or monitor treatment for MRSA infections. Performed at [facility], [address] [place], [place] [address]     Coagulation Studies: Recent Labs    06/06/18 5686  LABPROT 12.4  INR 0.93    Imaging: Dg Chest 1 View  Result Date: 06/07/2018 CLINICAL DATA:  Respiratory failure. EXAM: CHEST  1 VIEW COMPARISON:  None. FINDINGS: New endotracheal tube is seen 3.7 cm above the carina. Gastric tube with side port extends into the stomach. Stable cardiomegaly with aortic atherosclerosis. Central vascular congestion with interstitial edema persists without significant change. Probable small left effusion with linear atelectasis at the left lung base. Redemonstration of retrocardiac more confluent opacities that may reflect atelectasis and/or pneumonia. IMPRESSION: 1. Satisfactory support line and tube positions as above. 2. Stable cardiomegaly with aortic atherosclerosis with central and interstitial edema noted. 3. Confluent opacities in the retrocardiac portion of the chest not exclude left lower lobe pneumonia or atelectasis. Electronically Signed   By: Ashley Royalty M.D.   On: 06/07/2018 20:37   Dg Abd 1 View  Result Date: 06/08/2018 CLINICAL DATA:  OG tube placement EXAM: ABDOMEN - 1 VIEW COMPARISON:  06/07/2018 FINDINGS: Enteric tube remains coiled in the left upper quadrant consistent with location in the body of the stomach. Scattered gas and stool in the colon. No bowel distention is identified. IMPRESSION: Enteric tube remains coiled in the left upper quadrant consistent with location in the body of the stomach. Electronically Signed   By: Lucienne Capers M.D.   On: 06/08/2018 01:53   Dg Abd 1 View  Result Date: 06/07/2018 CLINICAL DATA:  Gastric placement EXAM: ABDOMEN - 1 VIEW COMPARISON:  None. FINDINGS: The tip and side port of a gastric tube are noted coiled within the proximal half of the stomach. The tip projects within the gastric cardia. Nonobstructed, scattered  bowel gas pattern. IMPRESSION: The tip and side port of a gastric tube are coiled within the proximal half of the stomach. Electronically Signed   By: Ashley Royalty M.D.   On: 06/07/2018 20:38   Mr Brain Wo Contrast  Result Date: 06/06/2018 CLINICAL DATA:  Initial evaluation for acute onset severe dizziness. EXAM: MRI HEAD WITHOUT CONTRAST MRA HEAD WITHOUT CONTRAST TECHNIQUE: Multiplanar, multiecho pulse sequences of the brain and surrounding structures were obtained without intravenous contrast. Angiographic images of the head were obtained using MRA technique without contrast. COMPARISON:  Comparison made with prior CT and CTA from earlier the same day. FINDINGS: MRI HEAD FINDINGS Brain: Diffuse prominence of the CSF containing spaces compatible generalized age-related cerebral atrophy. Patchy and confluent T2/FLAIR hyperintensity within the periventricular and deep white matter both cerebral hemispheres noted, nonspecific, but most likely related chronic microvascular ischemic changes. Changes related underlying demyelinating disease could conceivably be considered as well. Multiple superimposed remote lacunar infarcts present within the bilateral hemispheric cerebral white matter, basal ganglia, thalami, and pons. Confluent focus of restricted diffusion measuring 13 x 10 x 6 mm seen involving the lateral left medulla, consistent with acute ischemic infarct (series 5, image  12). No associated hemorrhage or mass effect. Few additional scattered mild foci of diffusion weighted signal abnormality involving the supratentorial cerebral white matter and right thalamus felt to be most consistent with T2 shine through. No other definite acute or subacute ischemic infarct. No encephalomalacia to suggest chronic cortical infarction. No foci of susceptibility artifact to suggest acute or chronic intracranial hemorrhage. No mass lesion, midline shift or mass effect. No hydrocephalus. No extra-axial fluid collection.  Pituitary gland normal. Vascular: Major intracranial vascular flow voids maintained. Skull and upper cervical spine: Craniocervical junction normal. Bone marrow signal intensity normal. No scalp soft tissue abnormality. Sinuses/Orbits: Globes and orbital soft tissues within normal limits. Scattered mucosal thickening throughout the paranasal sinuses. No air-fluid level to suggest acute sinusitis. No mastoid effusion. Inner ear structures grossly normal. Other: None. MRA HEAD FINDINGS ANTERIOR CIRCULATION: Distal cervical segments of the internal carotid arteries are widely patent with antegrade flow. Petrous, cavernous, and supraclinoid segments widely patent without stenosis. ICA termini well perfused and symmetric. A1 segments, anterior communicating artery common anterior cerebral arteries widely patent bilaterally. No M1 stenosis or occlusion. No proximal M2 occlusion. Distal MCA branches well perfused and symmetric POSTERIOR CIRCULATION: Short-segment mild to moderate distal left V4 stenosis (series 9, image 8). Visualized vertebral arteries otherwise patent to the vertebrobasilar junction. Basilar mildly tortuous but widely patent to its distal aspect without stenosis. Superior cerebral arteries patent bilaterally. PCA supplied via the basilar. Fetal type origin of the right PCA supplied via a widely patent right posterior communicating artery. Visualized PCAs patent to their distal aspects without appreciable stenosis. Distal small vessel atheromatous irregularity noted throughout the intracranial circulation IMPRESSION: MRI HEAD IMPRESSION: 1. 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 2. Moderate cerebral white matter disease, most likely related to chronic microvascular ischemic disease. Superimposed multiple chronic lacunar infarcts seen involving the hemispheric cerebral white matter, deep gray nuclei, and pons. MRA HEAD IMPRESSION: 1. Negative intracranial MRA for large  vessel occlusion. 2. Short-segment mild to moderate distal left V4 atheromatous stenosis. Visualized right vertebral artery and basilar artery widely patent. No other hemodynamically significant or correctable stenosis identified. 3. Distal small vessel atheromatous irregularity throughout the intracranial circulation. Electronically Signed   By: Jeannine Boga M.D.   On: 06/06/2018 15:45   Dg Chest Port 1 View  Result Date: 06/08/2018 CLINICAL DATA:  Pneumonia EXAM: PORTABLE CHEST 1 VIEW COMPARISON:  06/07/2018 FINDINGS: Endotracheal tube tip measures 3.5 cm above the carina. Enteric tube is coiled in the left upper quadrant consistent with location in the upper stomach. Shallow inspiration with atelectasis in the lung bases. Consolidation or atelectasis behind the heart on the left is slightly improved since previous study. Cardiac enlargement with mild central vascular congestion. No pneumothorax. No blunting of costophrenic angles. IMPRESSION: Appliances appear in satisfactory location. Shallow inspiration with atelectasis in the lung bases. Cardiac enlargement with mild vascular congestion. Improvement of atelectasis or infiltration in the left base behind the heart. Electronically Signed   By: Lucienne Capers M.D.   On: 06/08/2018 01:55   Dg Chest Port 1 View  Result Date: 06/07/2018 CLINICAL DATA:  Atelectasis EXAM: PORTABLE CHEST 1 VIEW COMPARISON:  Chest radiograph 06/07/2018 FINDINGS: Interval extubation and removal of enteric tube. Monitoring leads overlie the patient. Low lung volumes. Persistent heterogeneous opacities left lung base. Possible small left pleural effusion. Cardiomegaly. IMPRESSION: Similar-appearing heterogeneous opacities left lung base which may represent atelectasis. Cardiomegaly. Interval extubation and removal of enteric tube. Electronically Signed  By: Lovey Newcomer M.D.   On: 06/07/2018 19:38   Dg Chest Port 1 View  Result Date: 06/07/2018 CLINICAL DATA:   Intubation EXAM: PORTABLE CHEST 1 VIEW COMPARISON:  06/06/2018 FINDINGS: Endotracheal tube in good position. NG tube is been placed in the stomach. Decreased lung volume. Bibasilar atelectasis unchanged. No significant effusion or edema. IMPRESSION: Endotracheal tube in good position. Low lung volumes with bibasilar atelectasis unchanged. Electronically Signed   By: Franchot Gallo M.D.   On: 06/07/2018 10:51   Mr Jodene Nam Head/brain WR Cm  Result Date: 06/06/2018 CLINICAL DATA:  Initial evaluation for acute onset severe dizziness. EXAM: MRI HEAD WITHOUT CONTRAST MRA HEAD WITHOUT CONTRAST TECHNIQUE: Multiplanar, multiecho pulse sequences of the brain and surrounding structures were obtained without intravenous contrast. Angiographic images of the head were obtained using MRA technique without contrast. COMPARISON:  Comparison made with prior CT and CTA from earlier the same day. FINDINGS: MRI HEAD FINDINGS Brain: Diffuse prominence of the CSF containing spaces compatible generalized age-related cerebral atrophy. Patchy and confluent T2/FLAIR hyperintensity within the periventricular and deep white matter both cerebral hemispheres noted, nonspecific, but most likely related chronic microvascular ischemic changes. Changes related underlying demyelinating disease could conceivably be considered as well. Multiple superimposed remote lacunar infarcts present within the bilateral hemispheric cerebral white matter, basal ganglia, thalami, and pons. Confluent focus of restricted diffusion measuring 13 x 10 x 6 mm seen involving the lateral left medulla, consistent with acute ischemic infarct (series 5, image 12). No associated hemorrhage or mass effect. Few additional scattered mild foci of diffusion weighted signal abnormality involving the supratentorial cerebral white matter and right thalamus felt to be most consistent with T2 shine through. No other definite acute or subacute ischemic infarct. No encephalomalacia to  suggest chronic cortical infarction. No foci of susceptibility artifact to suggest acute or chronic intracranial hemorrhage. No mass lesion, midline shift or mass effect. No hydrocephalus. No extra-axial fluid collection. Pituitary gland normal. Vascular: Major intracranial vascular flow voids maintained. Skull and upper cervical spine: Craniocervical junction normal. Bone marrow signal intensity normal. No scalp soft tissue abnormality. Sinuses/Orbits: Globes and orbital soft tissues within normal limits. Scattered mucosal thickening throughout the paranasal sinuses. No air-fluid level to suggest acute sinusitis. No mastoid effusion. Inner ear structures grossly normal. Other: None. MRA HEAD FINDINGS ANTERIOR CIRCULATION: Distal cervical segments of the internal carotid arteries are widely patent with antegrade flow. Petrous, cavernous, and supraclinoid segments widely patent without stenosis. ICA termini well perfused and symmetric. A1 segments, anterior communicating artery common anterior cerebral arteries widely patent bilaterally. No M1 stenosis or occlusion. No proximal M2 occlusion. Distal MCA branches well perfused and symmetric POSTERIOR CIRCULATION: Short-segment mild to moderate distal left V4 stenosis (series 9, image 8). Visualized vertebral arteries otherwise patent to the vertebrobasilar junction. Basilar mildly tortuous but widely patent to its distal aspect without stenosis. Superior cerebral arteries patent bilaterally. PCA supplied via the basilar. Fetal type origin of the right PCA supplied via a widely patent right posterior communicating artery. Visualized PCAs patent to their distal aspects without appreciable stenosis. Distal small vessel atheromatous irregularity noted throughout the intracranial circulation IMPRESSION: MRI HEAD IMPRESSION: 1. 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 2. Moderate cerebral white matter disease, most likely related to  chronic microvascular ischemic disease. Superimposed multiple chronic lacunar infarcts seen involving the hemispheric cerebral white matter, deep gray nuclei, and pons. MRA HEAD IMPRESSION: 1. Negative intracranial MRA for large vessel occlusion. 2. Short-segment mild  to moderate distal left V4 atheromatous stenosis. Visualized right vertebral artery and basilar artery widely patent. No other hemodynamically significant or correctable stenosis identified. 3. Distal small vessel atheromatous irregularity throughout the intracranial circulation. Electronically Signed   By: Jeannine Boga M.D.   On: 06/06/2018 15:45    Medications:  I have reviewed the patient's current medications. Prior to Admission:  Medications Prior to Admission  Medication Sig Dispense Refill Last Dose  . acetaminophen (TYLENOL) 325 MG tablet Take 650 mg by mouth every 6 (six) hours as needed.   06/05/2018 at 0800  . calcium-vitamin D (OSCAL WITH D) 500-200 MG-UNIT tablet Take 1 tablet by mouth daily.   06/05/2018 at 0800  . Coenzyme Q10 (COQ10) 100 MG CAPS Take 1 capsule by mouth daily.   06/05/2018 at 0800  . diclofenac (VOLTAREN) 75 MG EC tablet Take 75 mg by mouth 2 (two) times daily.   06/05/2018 at 0800  . Multiple Vitamins-Minerals (MULTIVITAMIN ADULTS 50+) TABS Take 1 tablet by mouth daily.   06/05/2018 at 0800   Scheduled: . aspirin  300 mg Rectal Daily  . chlorhexidine gluconate (MEDLINE KIT)  15 mL Mouth Rinse BID  . dexamethasone  4 mg Intravenous Q8H  . fentaNYL (SUBLIMAZE) injection  50 mcg Intravenous Once  . mouth rinse  15 mL Mouth Rinse 10 times per day   Patient seen and examined.  Clinical course and management discussed.  Necessary edits performed.  I agree with the above.  Assessment and plan of care developed and discussed below.    Assessment:67 y.o.malewith past medical history of Legg-Calve-Perthes,psoriaticarthritis, hypertension, chronic back and neck pain, presenting with episodes of  vertigo, unsteadiness, nausea/vomiting, slurred speech, difficulty swallowing and left facial numbness.Found to have left lateral medullary infarct on imaging, likely secondary to small vessel disease. Patient failed extubation due to inability to handle secretions and desaturations. He was re-intubated for airway protection. Remains stable with no new neurological findings. Echocardiogram did not show cardiac source of emboli.  Plan: 1. Prophylactic therapy-Antiplatelet med: Continue Aspirin - dose316m rectally qd 2. PT/OT/Speech therapy 3. Telemetry monitoring 4. Frequent neuro checks 5. Statin to be initiated once able to take po with goal LDL<70 6. Smoking cessation counseling  This patient was staffed with Dr. LMagda Paganini RDoy Mincewho personally evaluated patient, reviewed documentation and agreed with assessment and plan of care as above.  ERufina Falco DNP, FNP-BC Board certified Nurse Practitioner Neurology Department    LOS: 2 days   06/08/2018  10:09 AM  LAlexis Goodell MD Neurology [phone] 06/08/2018  3:56 PM

## 2018-06-08 NOTE — Progress Notes (Addendum)
SLP Cancellation Note  Patient Details Name: Dillon Bradley MRN: [id number] DOB: Sep 26, 1950   Cancelled treatment:       Reason Eval/Treat Not Completed: Medical issues which prohibited therapy;Patient not medically ready(chart reviewed; consulted NSG).  Pt appeared to have a decline in his respiratory status during the evening and required re-intubation; noted continuous "gurgly" airway presentation and low O2 sats per chart notes. 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  ST services will be available for assessment of swallowing function when pt can remain extubated. A Communication Board was provided to pt/family; pt is alert and able to use gestures and attempts writing to communicate his needs. No immediate Cognitive-linguistic deficits noted per Neurology notes, family.   Dillon SomKatherine Itha Kroeker, MS, CCC-SLP [name] 06/08/2018, 8:03 AM

## 2018-06-08 NOTE — Progress Notes (Signed)
ABD xray revealed NG tube coiled. Verbal order received from NP to pull back OG 6 CM. Repeat ABD xray ordered. Xray revealed NG was coiled but in stomach. NP satisfied with these results and gave verbal order that meds can be pushed through tube. Will continue to monitor.

## 2018-06-08 NOTE — Progress Notes (Signed)
Primary RN aware that PICC line will be placed tomorrow.

## 2018-06-08 NOTE — Progress Notes (Signed)
[employer] - [facility]   PATIENT NAME: Dillon Bradley    MR#:  [id number]  DATE OF BIRTH:  [birth year]  SUBJECTIVE:  CHIEF COMPLAINT:   Chief Complaint  Patient presents with  . Dizziness  on vent, unable to wean REVIEW OF SYSTEMS:  ROS: unable to obtain due to on vent DRUG ALLERGIES:   Allergies  Allergen Reactions  . Aspirin     Pt overdosed on ASA fifty years ago.   VITALS:  Blood pressure (!) 144/76, pulse 70, temperature 99.4 F (37.4 C), temperature source Axillary, resp. rate 17, height 5\' 8"  (1.727 m), weight 98.7 kg, SpO2 98 %. PHYSICAL EXAMINATION:  Physical Exam  HENT:  Head: Normocephalic and atraumatic.  ETT in place  Eyes: Pupils are equal, round, and reactive to light. Conjunctivae and EOM are normal.  Neck: Normal range of motion. Neck supple. No tracheal deviation present. No thyromegaly present.  Cardiovascular: Normal rate, regular rhythm and normal heart sounds.  Pulmonary/Chest: Effort normal and breath sounds normal. No respiratory distress. He has no wheezes. He exhibits no tenderness.  Abdominal: Soft. Bowel sounds are normal. He exhibits no distension. There is no tenderness.  Musculoskeletal: Normal range of motion.  Neurological: No cranial nerve deficit.  On vent  Skin: Skin is warm and dry. No rash noted.  Psychiatric:  On vent   LABORATORY PANEL:  Male CBC Recent Labs  Lab 06/08/18 0618  WBC 16.7*  HGB 13.2  HCT 40.7  PLT 280   ------------------------------------------------------------------------------------------------------------------ Chemistries  Recent Labs  Lab 06/06/18 0634  06/08/18 0618  NA 138   < > 140  K 4.0   < > 4.1  CL 101   < > 105  CO2 26   < > 27  GLUCOSE 138*   < > 133*  BUN 16   < > 14  CREATININE 0.73   < > 0.81  CALCIUM 9.7   < > 8.4*  AST 26  --   --   ALT 29  --   --   ALKPHOS 77  --   --   BILITOT 0.8  --   --    < > = values in this interval not displayed.    RADIOLOGY:  Dg Chest 1 View  Result Date: 06/07/2018 CLINICAL DATA:  Respiratory failure. EXAM: CHEST  1 VIEW COMPARISON:  None. FINDINGS: New endotracheal tube is seen 3.7 cm above the carina. Gastric tube with side port extends into the stomach. Stable cardiomegaly with aortic atherosclerosis. Central vascular congestion with interstitial edema persists without significant change. Probable small left effusion with linear atelectasis at the left lung base. Redemonstration of retrocardiac more confluent opacities that may reflect atelectasis and/or pneumonia. IMPRESSION: 1. Satisfactory support line and tube positions as above. 2. Stable cardiomegaly with aortic atherosclerosis with central and interstitial edema noted. 3. Confluent opacities in the retrocardiac portion of the chest not exclude left lower lobe pneumonia or atelectasis. Electronically Signed   By: Tollie Eth M.D.   On: 06/07/2018 20:37   Dg Abd 1 View  Result Date: 06/08/2018 CLINICAL DATA:  OG tube placement EXAM: ABDOMEN - 1 VIEW COMPARISON:  06/07/2018 FINDINGS: Enteric tube remains coiled in the left upper quadrant consistent with location in the body of the stomach. Scattered gas and stool in the colon. No bowel distention is identified. IMPRESSION: Enteric tube remains coiled in the left upper quadrant consistent with location in the body of the stomach. Electronically Signed  By: Burman NievesWilliam  Stevens M.D.   On: 06/08/2018 01:53   Dg Abd 1 View  Result Date: 06/07/2018 CLINICAL DATA:  Gastric placement EXAM: ABDOMEN - 1 VIEW COMPARISON:  None. FINDINGS: The tip and side port of a gastric tube are noted coiled within the proximal half of the stomach. The tip projects within the gastric cardia. Nonobstructed, scattered bowel gas pattern. IMPRESSION: The tip and side port of a gastric tube are coiled within the proximal half of the stomach. Electronically Signed   By: Tollie Ethavid  Kwon M.D.   On: 06/07/2018 20:38   Dg Chest Port 1  View  Result Date: 06/08/2018 CLINICAL DATA:  Pneumonia EXAM: PORTABLE CHEST 1 VIEW COMPARISON:  06/07/2018 FINDINGS: Endotracheal tube tip measures 3.5 cm above the carina. Enteric tube is coiled in the left upper quadrant consistent with location in the upper stomach. Shallow inspiration with atelectasis in the lung bases. Consolidation or atelectasis behind the heart on the left is slightly improved since previous study. Cardiac enlargement with mild central vascular congestion. No pneumothorax. No blunting of costophrenic angles. IMPRESSION: Appliances appear in satisfactory location. Shallow inspiration with atelectasis in the lung bases. Cardiac enlargement with mild vascular congestion. Improvement of atelectasis or infiltration in the left base behind the heart. Electronically Signed   By: Burman NievesWilliam  Stevens M.D.   On: 06/08/2018 01:55   Dg Chest Port 1 View  Result Date: 06/07/2018 CLINICAL DATA:  Atelectasis EXAM: PORTABLE CHEST 1 VIEW COMPARISON:  Chest radiograph 06/07/2018 FINDINGS: Interval extubation and removal of enteric tube. Monitoring leads overlie the patient. Low lung volumes. Persistent heterogeneous opacities left lung base. Possible small left pleural effusion. Cardiomegaly. IMPRESSION: Similar-appearing heterogeneous opacities left lung base which may represent atelectasis. Cardiomegaly. Interval extubation and removal of enteric tube. Electronically Signed   By: Annia Beltrew  Davis M.D.   On: 06/07/2018 19:38   ASSESSMENT AND PLAN:  [age] y m with acute stroke  * Acute Stroke - ASA + Statin - echo wnl  * Acute respiratory failure intubated for airway protection with new onset CVA - weaning per PCCM  *Aspiration pneumonia with right bibasilar airspace disease -Empiric Unasyn.  * Hypotension: resolved  * Hyperglycemia - monitor sugars      All the records are reviewed and case discussed with Care Management/Social Worker. Management plans discussed with the patient,  nursing and they are in agreement.  CODE STATUS: Full Code  TOTAL TIME TAKING CARE OF THIS PATIENT: 15 minutes.   More than 50% of the time was spent in counseling/coordination of care: YES  POSSIBLE D/C IN 2-3 DAYS, DEPENDING ON CLINICAL CONDITION.   Delfino LovettVipul Anayansi Rundquist M.D on 06/08/2018 at 11:27 AM  Between 7am to 6pm - Pager - [phone]  After 6pm go to [email or web address] - password [email or web address] [facility]  [facility]  Office  [phone]  CC: Primary care physician; Delia HeadyKurz, James E, MD  Note: This dictation was prepared with Dragon dictation along with smaller phrase technology. Any transcriptional errors that result from this process are unintentional.

## 2018-06-08 NOTE — Progress Notes (Signed)
Per Dr. Duanne LimerickSamaan, Pt's fentanyl titrated up to 37100mcgs/h.

## 2018-06-08 NOTE — Consult Note (Signed)
Pharmacy Antibiotic Note  Dillon Bradley is a 67 y.o. male admitted on 06/06/2018 with aspiration pneumonia.  Pharmacy has been consulted for Unasyn dosing.  Plan: Day 3 IV Abx. Continue Unasyn 3 gm IV every 6 hours  Height: 5\' 8"  (172.7 cm) Weight: 217 lb 9.5 oz (98.7 kg) IBW/kg (Calculated) : 68.4  Temp (24hrs), Avg:100.1 F (37.8 C), Min:99.4 F (37.4 C), Max:101.2 F (38.4 C)  Recent Labs  Lab 06/06/18 0634 06/07/18 0430 06/08/18 0618  WBC 16.0* 12.0* 16.7*  CREATININE 0.73 0.84 0.81    Estimated Creatinine Clearance: 99.4 mL/min (by C-G formula based on SCr of 0.81 mg/dL).    Allergies  Allergen Reactions  . Aspirin     Pt overdosed on ASA fifty years ago.    Antimicrobials this admission: Unasyn 11/20 >>   Microbiology results:   MRSA PCR: Negative   Thank you for allowing pharmacy to be a part of this patient's care.  Gardner CandleSheema M Syleena Mchan, PharmD, BCPS Clinical Pharmacist 06/08/2018 1:09 PM

## 2018-06-08 NOTE — Progress Notes (Signed)
Per Dr. Hollace HaywardSamaan's order, PICC line required for pt. IV team consulted. [facility] team.

## 2018-06-08 NOTE — Progress Notes (Signed)
   Name: Dillon Bradley MRN: [id number] DOB: September 18, 1950     CONSULTATION DATE: 06/06/2018 Subjective & Objectives: Reintubated, On Fentanyl and a febrile  PAST MEDICAL HISTORY :   has a past medical history of Arthritis, Chronic pain, Hypertension, Legg-Calve-Perthes disease, and Psoriasis.  has a past surgical history that includes Nose surgery. Prior to Admission medications   Medication Sig Start Date End Date Taking? Authorizing Provider  acetaminophen (TYLENOL) 325 MG tablet Take 650 mg by mouth every 6 (six) hours as needed.   Yes [provider]  calcium-vitamin D (OSCAL WITH D) 500-200 MG-UNIT tablet Take 1 tablet by mouth daily.   Yes [provider]  Coenzyme Q10 (COQ10) 100 MG CAPS Take 1 capsule by mouth daily.   Yes [provider]  diclofenac (VOLTAREN) 75 MG EC tablet Take 75 mg by mouth 2 (two) times daily.   Yes [provider]  Multiple Vitamins-Minerals (MULTIVITAMIN ADULTS 50+) TABS Take 1 tablet by mouth daily.   Yes [provider]   Allergies  Allergen Reactions  . Aspirin     Pt overdosed on ASA fifty years ago.    FAMILY HISTORY:  family history includes Arthritis in his brother; Colon cancer in his father; Diabetes in his brother; Hypertension in his father and mother; Kidney cancer in his father; Ovarian cancer in his sister; Stroke in his mother. SOCIAL HISTORY:  reports that he has been smoking cigars. He has never used smokeless tobacco. He reports that he drinks about 4.0 standard drinks of alcohol per week. He reports that he does not use drugs.  REVIEW OF SYSTEMS:   Unable to obtain due to critical illness   VITAL SIGNS: Temp:  [98.4 F (36.9 C)-101.2 F (38.4 C)] 98.4 F (36.9 C) (11/22 1200) Pulse Rate:  [55-96] 75 (11/22 1300) Resp:  [15-30] 19 (11/22 1300) BP: (104-165)/(38-105) 153/71 (11/22 1300) SpO2:  [88 %-100 %] 99 % (11/22 1300) FiO2 (%):  [40 %-60 %] 40 % (11/22 1510)  Physical  Examination:  Rass of -2 and following simple commands On a ventilator, no distress, bilateral equal air entry with no adventitious sounds S1 & S2 are audible with no murmur Benign abdominal exam with normal peristalsis No edema  ASSESSMENT / PLAN:  Acute respiratory failure, extubate on 06/07/2018 and had to be reintubed because of unable to protect airway with desaturation. Continues to have poor weaning parameter because of no spontaneous cough when awake and weak gag reflex. -Continue with vent support and consider trach and PEG as discussed with the family.  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  No large vessel occlusion with cerebral angiogram. ECHO 06/07/2018 LVEF 60-65% and normal wall motion -Aspirin + statin -Monitor neuro status and management as per neurology  Aspiration pneumonia. improved right bibasilar airspace disease. MRSA PCR -ve -Empiric Unasyn. Monitor CXR + CBC + FiO2  Hypertension -Optimize antihypertensives and monitor hemodynamics  Full code  DVT & GI prophylaxis. Continue with supportive care Family (wife and son) were updated at the bedside and they agreed to the plan of care Critical care time 40 min

## 2018-06-09 ENCOUNTER — Inpatient Hospital Stay: Payer: Medicare Other

## 2018-06-09 LAB — GLUCOSE, CAPILLARY
GLUCOSE-CAPILLARY: 169 mg/dL — AB (ref 70–99)
Glucose-Capillary: 147 mg/dL — ABNORMAL HIGH (ref 70–99)
Glucose-Capillary: 150 mg/dL — ABNORMAL HIGH (ref 70–99)

## 2018-06-09 MED ORDER — DEXMEDETOMIDINE HCL IN NACL 400 MCG/100ML IV SOLN
0.2000 ug/kg/h | INTRAVENOUS | Status: DC
  Administered 2018-06-09: 0.7 ug/kg/h via INTRAVENOUS
  Administered 2018-06-10: 0.4 ug/kg/h via INTRAVENOUS
  Filled 2018-06-09 (×2): qty 100

## 2018-06-09 MED ORDER — SODIUM CHLORIDE 0.9% FLUSH
10.0000 mL | Freq: Two times a day (BID) | INTRAVENOUS | Status: DC
  Administered 2018-06-09: 10 mL

## 2018-06-09 MED ORDER — SODIUM CHLORIDE 0.9% FLUSH: 10.0000 mL | INTRAVENOUS | Status: DC | PRN

## 2018-06-09 MED ORDER — ALPRAZOLAM 0.25 MG PO TABS
0.2500 mg | ORAL_TABLET | Freq: Two times a day (BID) | ORAL | Status: DC | PRN
  Administered 2018-06-10: 0.25 mg via ORAL
  Filled 2018-06-09 (×2): qty 1

## 2018-06-09 NOTE — Consult Note (Signed)
Pharmacy Antibiotic Note  Dillon Bradley is a 67 y.o. male admitted on 06/06/2018 with aspiration pneumonia.  Pharmacy has been consulted for Unasyn dosing.  Plan: Day 4 IV Abx. Continue Unasyn 3 gm IV every 6 hours   Height: 5\' 8"  (172.7 cm) Weight: 217 lb 9.5 oz (98.7 kg) IBW/kg (Calculated) : 68.4  Temp (24hrs), Avg:98.1 F (36.7 C), Min:98.1 F (36.7 C), Max:98.1 F (36.7 C)  Recent Labs  Lab 06/06/18 0634 06/07/18 0430 06/08/18 0618  WBC 16.0* 12.0* 16.7*  CREATININE 0.73 0.84 0.81    Estimated Creatinine Clearance: 99.4 mL/min (by C-G formula based on SCr of 0.81 mg/dL).    Allergies  Allergen Reactions  . Aspirin     Pt overdosed on ASA fifty years ago.    Antimicrobials this admission: Unasyn 11/20 >>   Microbiology results:   MRSA PCR: Negative   Thank you for allowing pharmacy to be a part of this patient's care.  Angelique BlonderMerrill,Mel Langan A, PharmD, BCPS Clinical Pharmacist 06/09/2018 4:11 PM

## 2018-06-09 NOTE — Progress Notes (Signed)
LCSW was asked to text Josh care manager to see this patient to get some in home supports. Texted  Josh and let him know about this patient.  No further SW needs at this time

## 2018-06-09 NOTE — Progress Notes (Signed)
[employer] - [place] [facility]   PATIENT NAME: Dillon Bradley    MR#:  [id number]  DATE OF BIRTH:  Feb 01, 1951  SUBJECTIVE:  on vent, unable to wean REVIEW OF SYSTEMS:  ROS: unable to obtain due to on vent DRUG ALLERGIES:   Allergies  Allergen Reactions  . Aspirin     Pt overdosed on ASA fifty years ago.   VITALS:  Blood pressure (!) 152/80, pulse 70, temperature 98.1 F (36.7 C), resp. rate 19, height 5\' 8"  (1.727 m), weight 98.7 kg, SpO2 100 %. PHYSICAL EXAMINATION:  Physical Exam  HENT:  Head: Normocephalic and atraumatic.  ETT in place  Eyes: Pupils are equal, round, and reactive to light. Conjunctivae and EOM are normal.  Neck: Normal range of motion. Neck supple. No tracheal deviation present. No thyromegaly present.  Cardiovascular: Normal rate, regular rhythm and normal heart sounds.  Pulmonary/Chest: Effort normal and breath sounds normal. No respiratory distress. He has no wheezes. He exhibits no tenderness.  Abdominal: Soft. Bowel sounds are normal. He exhibits no distension. There is no tenderness.  Musculoskeletal: Normal range of motion.  Neurological: No cranial nerve deficit.  On vent  Skin: Skin is warm and dry. No rash noted.  Psychiatric:  On vent   LABORATORY PANEL:  Male CBC Recent Labs  Lab 06/08/18 0618  WBC 16.7*  HGB 13.2  HCT 40.7  PLT 280   ------------------------------------------------------------------------------------------------------------------ Chemistries  Recent Labs  Lab 06/06/18 0634  06/08/18 0618  NA 138   < > 140  K 4.0   < > 4.1  CL 101   < > 105  CO2 26   < > 27  GLUCOSE 138*   < > 133*  BUN 16   < > 14  CREATININE 0.73   < > 0.81  CALCIUM 9.7   < > 8.4*  AST 26  --   --   ALT 29  --   --   ALKPHOS 77  --   --   BILITOT 0.8  --   --    < > = values in this interval not displayed.   RADIOLOGY:  [place] Ekg Site Rite  Result Date: 06/08/2018 If Site Rite image not attached, placement  could not be confirmed due to current cardiac rhythm.  ASSESSMENT AND PLAN:  [age] y m with acute stroke  * Acute Stroke left lateral medullary infarct - ASA + Statin - echo wnl  * Acute respiratory failure intubated for airway protection with new onset CVA - weaning per PCCM -patient was re-intubated 1122 2019 secondary to unable to tolerate secretions and poor gag reflex. -ICU attending to discuss with family for possible trach and peg  *Aspiration pneumonia with right bibasilar airspace disease -Empiric Unasyn.  * Hypotension: resolved  * Hyperglycemia - monitor sugars  D/w son All the records are reviewed and case discussed with Care Management/Social Worker. Management plans discussed with the patient, nursing and they are in agreement.  CODE STATUS: Full Code  TOTAL TIME TAKING CARE OF THIS PATIENT: 15 minutes.   More than 50% of the time was spent in counseling/coordination of care: YES  POSSIBLE D/C IN 2-3 DAYS, DEPENDING ON CLINICAL CONDITION.   Enedina FinnerSona Kelton Bultman M.D on 06/09/2018 at 2:26 PM  Between 7am to 6pm - Pager - [phone]  After 6pm go to [email or web address] - Social research officer, governmentpassword [email or web address]  [facility] [place] [facility]  Office  [phone]  CC: Primary care physician; Delia HeadyKurz, James E, MD  Note: This dictation was prepared with Dragon dictation along with smaller phrase technology. Any transcriptional errors that result from this process are unintentional.

## 2018-06-09 NOTE — Progress Notes (Signed)
   Name: Dillon Bradley MRN: [id number] DOB: 08/08/1950     CONSULTATION DATE: 06/06/2018  Active & objectives: Fentanyl drip for sedation.  Afebrile  PAST MEDICAL HISTORY :   has a past medical history of Arthritis, Chronic pain, Hypertension, Legg-Calve-Perthes disease, and Psoriasis.  has a past surgical history that includes Nose surgery. Prior to Admission medications   Medication Sig Start Date End Date Taking? Authorizing Provider  acetaminophen (TYLENOL) 325 MG tablet Take 650 mg by mouth every 6 (six) hours as needed.   Yes [provider]  calcium-vitamin D (OSCAL WITH D) 500-200 MG-UNIT tablet Take 1 tablet by mouth daily.   Yes [provider]  Coenzyme Q10 (COQ10) 100 MG CAPS Take 1 capsule by mouth daily.   Yes [provider]  diclofenac (VOLTAREN) 75 MG EC tablet Take 75 mg by mouth 2 (two) times daily.   Yes [provider]  Multiple Vitamins-Minerals (MULTIVITAMIN ADULTS 50+) TABS Take 1 tablet by mouth daily.   Yes [provider]   Allergies  Allergen Reactions  . Aspirin     Pt overdosed on ASA fifty years ago.    FAMILY HISTORY:  family history includes Arthritis in his brother; Colon cancer in his father; Diabetes in his brother; Hypertension in his father and mother; Kidney cancer in his father; Ovarian cancer in his sister; Stroke in his mother. SOCIAL HISTORY:  reports that he has been smoking cigars. He has never used smokeless tobacco. He reports that he drinks about 4.0 standard drinks of alcohol per week. He reports that he does not use drugs.  REVIEW OF SYSTEMS:   Unable to obtain due to critical illness   VITAL SIGNS: Temp:  [98 F (36.7 C)-98.1 F (36.7 C)] 98.1 F (36.7 C) (11/23 0714) Pulse Rate:  [53-108] 70 (11/23 1000) Resp:  [13-24] 19 (11/23 1000) BP: (126-168)/(60-91) 152/80 (11/23 1000) SpO2:  [94 %-100 %] 97 % (11/23 1400) FiO2 (%):  [40 %-45 %] 40 % (11/23 1400)  Physical  Examination: Rass of -2 and following simple commands.  No spontaneous cough, weak gag reflex and equal motor power 4 extremities On a ventilator, no distress, bilateral equal air entry with no adventitious sounds S1 & S2 are audible with no murmur Benign abdominal exam with normal peristalsis No edema  ASSESSMENT / PLAN:  Acute respiratory failure, extubate on 06/07/2018 and had to be reintubed because of unable to protect airway with desaturation. Continues to have poor weaning parameter because of no spontaneous cough when awake and weak gag reflex. -Family agreed to trach and PEG if he continues to have poor weaning parameter by next week.  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 No large vessel occlusion with cerebral angiogram. ECHO 06/07/2018 LVEF 60-65% and normal wall motion -Aspirin + statin -Monitor neuro status and management as per neurology  Aspiration pneumonia. improved rightbibasilarairspace disease. MRSA PCR -ve -Empiric Unasyn. Monitor CXR + CBC + FiO2  Hypertension -Optimize antihypertensives and monitor hemodynamics  Full code  DVT & GI prophylaxis. Continue with supportive care Long meeting with the family (wife and son) at the bedside, they were updated with the patient progress.  They agreed to the plan of care which will include consulting case management for LTAC placement, neurology follow-up, PT and starting on Xanax because of anxiety.  Critical care time 60 min

## 2018-06-09 NOTE — Progress Notes (Signed)
Peripherally Inserted Central Catheter/Midline Placement  The IV Nurse has discussed with the patient and/or persons authorized to consent for the patient, the purpose of this procedure and the potential benefits and risks involved with this procedure.  The benefits include less needle sticks, lab draws from the catheter, and the patient may be discharged home with the catheter. Risks include, but not limited to, infection, bleeding, blood clot (thrombus formation), and puncture of an artery; nerve damage and irregular heartbeat and possibility to perform a PICC exchange if needed/ordered by physician.  Alternatives to this procedure were also discussed.  Bard Power PICC patient education guide, fact sheet on infection prevention and patient information card has been provided to patient /or left at bedside.  Family at bedside. Pt alert and oriented, unable to speak, nods head in agreement with procedure.  Family signed consent.  PICC/Midline Placement Documentation  PICC Triple Lumen 06/09/18 PICC Right Basilic 44 cm 1 cm (Active)  Indication for Insertion or Continuance of Line Prolonged intravenous therapies;Chronic illness with exacerbations (CF, Sickle Cell, etc.);Limited venous access - need for IV therapy >5 days (PICC only) 06/09/2018  9:19 AM  Exposed Catheter (cm) 1 cm 06/09/2018  9:19 AM  Site Assessment Clean;Dry;Intact 06/09/2018  9:19 AM  Lumen #1 Status Flushed;Saline locked;Blood return noted 06/09/2018  9:19 AM  Lumen #2 Status Flushed;Saline locked;Blood return noted 06/09/2018  9:19 AM  Lumen #3 Status Saline locked;Flushed;Blood return noted 06/09/2018  9:19 AM  Dressing Type Transparent 06/09/2018  9:19 AM  Dressing Status Dry;Clean;Intact;Antimicrobial disc in place 06/09/2018  9:19 AM  Line Care Connections checked and tightened 06/09/2018  9:19 AM  Line Adjustment (NICU/IV Team Only) No 06/09/2018  9:19 AM  Dressing Intervention New dressing 06/09/2018  9:19 AM  Dressing  Change Due 06/16/18 06/09/2018  9:19 AM       Elliot Dallyiggs, Emett Stapel Wright 06/09/2018, 9:20 AM

## 2018-06-10 ENCOUNTER — Ambulatory Visit (HOSPITAL_COMMUNITY)
Admission: AD | Admit: 2018-06-10 | Discharge: 2018-06-10 | Disposition: A | Discharge status: Home / Self Care | Payer: Medicare Other | Source: Other Acute Inpatient Hospital | Attending: Internal Medicine | Admitting: Internal Medicine

## 2018-06-10 ENCOUNTER — Inpatient Hospital Stay: Payer: Medicare Other

## 2018-06-10 DIAGNOSIS — Z9911 Dependence on respirator [ventilator] status: Secondary | ICD-10-CM

## 2018-06-10 DIAGNOSIS — J9690 Respiratory failure, unspecified, unspecified whether with hypoxia or hypercapnia: Secondary | ICD-10-CM | POA: Insufficient documentation

## 2018-06-10 LAB — CBC WITH DIFFERENTIAL/PLATELET
ABS IMMATURE GRANULOCYTES: 0.04 10*3/uL (ref 0.00–0.07)
BASOS ABS: 0 10*3/uL (ref 0.0–0.1)
BASOS PCT: 0 %
Eosinophils Absolute: 0.1 10*3/uL (ref 0.0–0.5)
Eosinophils Relative: 1 %
HCT: 38 % — ABNORMAL LOW (ref 39.0–52.0)
Hemoglobin: 12.1 g/dL — ABNORMAL LOW (ref 13.0–17.0)
Immature Granulocytes: 0 %
Lymphocytes Relative: 14 %
Lymphs Abs: 1.4 10*3/uL (ref 0.7–4.0)
MCH: 30.3 pg (ref 26.0–34.0)
MCHC: 31.8 g/dL (ref 30.0–36.0)
MCV: 95 fL (ref 80.0–100.0)
MONO ABS: 1 10*3/uL (ref 0.1–1.0)
Monocytes Relative: 11 %
NRBC: 0 % (ref 0.0–0.2)
Neutro Abs: 7.3 10*3/uL (ref 1.7–7.7)
Neutrophils Relative %: 74 %
PLATELETS: 290 10*3/uL (ref 150–400)
RBC: 4 MIL/uL — AB (ref 4.22–5.81)
RDW: 13.6 % (ref 11.5–15.5)
WBC: 9.9 10*3/uL (ref 4.0–10.5)

## 2018-06-10 LAB — BASIC METABOLIC PANEL
Anion gap: 8 (ref 5–15)
BUN: 26 mg/dL — ABNORMAL HIGH (ref 8–23)
CO2: 28 mmol/L (ref 22–32)
CREATININE: 0.68 mg/dL (ref 0.61–1.24)
Calcium: 8.7 mg/dL — ABNORMAL LOW (ref 8.9–10.3)
Chloride: 106 mmol/L (ref 98–111)
GFR calc Af Amer: >60 mL/min (ref >60)
Glucose, Bld: 133 mg/dL — ABNORMAL HIGH (ref 70–99)
Potassium: 3.5 mmol/L (ref 3.5–5.1)
SODIUM: 142 mmol/L (ref 135–145)

## 2018-06-10 LAB — GLUCOSE, CAPILLARY
GLUCOSE-CAPILLARY: 104 mg/dL — AB (ref 70–99)
GLUCOSE-CAPILLARY: 116 mg/dL — AB (ref 70–99)
Glucose-Capillary: 91 mg/dL (ref 70–99)
Glucose-Capillary: 116 mg/dL — ABNORMAL HIGH (ref 70–99)

## 2018-06-10 MED ORDER — VITAL HIGH PROTEIN PO LIQD: 1000.0000 mL | ORAL | 0 refills | Status: AC

## 2018-06-10 MED ORDER — DEXMEDETOMIDINE HCL IN NACL 400 MCG/100ML IV SOLN: 0.2000 ug/kg/h | INTRAVENOUS | 0 refills | Status: AC

## 2018-06-10 MED ORDER — ASPIRIN 300 MG RE SUPP: 300.0000 mg | Freq: Every day | RECTAL | 0 refills | Status: AC

## 2018-06-10 MED ORDER — QUETIAPINE FUMARATE 25 MG PO TABS: 25.0000 mg | ORAL_TABLET | Freq: Every day | ORAL | 0 refills | Status: AC

## 2018-06-10 MED ORDER — PRO-STAT SUGAR FREE PO LIQD: 60.0000 mL | Freq: Two times a day (BID) | ORAL | 0 refills | Status: AC

## 2018-06-10 MED ORDER — CLONAZEPAM 1 MG PO TABS
1.0000 mg | ORAL_TABLET | Freq: Two times a day (BID) | ORAL | Status: DC | PRN
  Administered 2018-06-10: 1 mg via ORAL
  Filled 2018-06-10: qty 1

## 2018-06-10 MED ORDER — SODIUM CHLORIDE 0.9 % IV SOLN: 3.0000 g | Freq: Four times a day (QID) | INTRAVENOUS | 0 refills | Status: AC

## 2018-06-10 MED ORDER — QUETIAPINE FUMARATE 25 MG PO TABS: 25.0000 mg | ORAL_TABLET | Freq: Every day | ORAL | Status: DC

## 2018-06-10 MED ORDER — ADULT MULTIVITAMIN LIQUID CH: 15.0000 mL | Freq: Every day | ORAL | 0 refills | Status: AC

## 2018-06-10 MED ORDER — FENTANYL 2500MCG IN NS 250ML (10MCG/ML) PREMIX INFUSION: 0.0000 ug/h | INTRAVENOUS | 0 refills | Status: AC

## 2018-06-10 MED ORDER — FAMOTIDINE IN NACL 20-0.9 MG/50ML-% IV SOLN: 20.0000 mg | Freq: Two times a day (BID) | INTRAVENOUS | 0 refills | Status: AC

## 2018-06-10 MED ORDER — CLONAZEPAM 1 MG PO TABS: 1.0000 mg | ORAL_TABLET | Freq: Two times a day (BID) | ORAL | 0 refills | Status: AC | PRN

## 2018-06-10 MED ORDER — FREE WATER: 30.0000 mL | 0 refills | Status: AC

## 2018-06-10 MED ORDER — SENNOSIDES-DOCUSATE SODIUM 8.6-50 MG PO TABS: 1.0000 | ORAL_TABLET | Freq: Every evening | ORAL | 0 refills | Status: AC | PRN

## 2018-06-10 NOTE — Progress Notes (Signed)
[employer] [facility] [place] [facility]   PATIENT NAME: Dillon Bradley    MR#:  [id number]  DATE OF BIRTH:  1951/04/30  SUBJECTIVE:  on vent, unable to wean REVIEW OF SYSTEMS:  ROS: unable to obtain due to on vent DRUG ALLERGIES:   Allergies  Allergen Reactions  . Aspirin     Pt overdosed on ASA fifty years ago.   VITALS:  Blood pressure (!) 149/76, pulse 67, temperature 98.1 F (36.7 C), resp. rate 13, height 5\' 8"  (1.727 m), weight 98.7 kg, SpO2 100 %. PHYSICAL EXAMINATION:  Physical Exam  HENT:  Head: Normocephalic and atraumatic.  ETT in place  Eyes: Pupils are equal, round, and reactive to light. Conjunctivae and EOM are normal.  Neck: Normal range of motion. Neck supple. No tracheal deviation present. No thyromegaly present.  Cardiovascular: Normal rate, regular rhythm and normal heart sounds.  Pulmonary/Chest: Effort normal and breath sounds normal. No respiratory distress. He has no wheezes. He exhibits no tenderness.  Abdominal: Soft. Bowel sounds are normal. He exhibits no distension. There is no tenderness.  Musculoskeletal: Normal range of motion.  Neurological: No cranial nerve deficit.  On vent  Skin: Skin is warm and dry. No rash noted.  Psychiatric:  On vent   LABORATORY PANEL:  Male CBC Recent Labs  Lab 06/10/18 0613  WBC 9.9  HGB 12.1*  HCT 38.0*  PLT 290   ------------------------------------------------------------------------------------------------------------------ Chemistries  Recent Labs  Lab 06/06/18 0634  06/10/18 0613  NA 138   < > 142  K 4.0   < > 3.5  CL 101   < > 106  CO2 26   < > 28  GLUCOSE 138*   < > 133*  BUN 16   < > 26*  CREATININE 0.73   < > 0.68  CALCIUM 9.7   < > 8.7*  AST 26  --   --   ALT 29  --   --   ALKPHOS 77  --   --   BILITOT 0.8  --   --    < > = values in this interval not displayed.   RADIOLOGY:  Ct Head Wo Contrast  Result Date: 06/09/2018 CLINICAL DATA:  Stroke follow-up.   Lateral left medullary infarct EXAM: CT HEAD WITHOUT CONTRAST TECHNIQUE: Contiguous axial images were obtained from the base of the skull through the vertex without intravenous contrast. COMPARISON:  Brain MRI from 3 days ago FINDINGS: Brain: Known recent lateral left medullary infarct. Background of advanced chronic small vessel ischemia with remote lacunar infarcts in the brainstem, deep white matter, and bilateral deep gray nuclei. No hemorrhage, hydrocephalus, or cortical infarct. Vascular: Atherosclerotic calcification. Skull: Negative Sinuses/Orbits: Negative IMPRESSION: 1. Stable compared to 3 days prior. 2. Recent lateral left medullary infarct. 3. Numerous chronic lacunar infarcts. Electronically Signed   By: Marnee SpringJonathon  Watts M.D.   On: 06/09/2018 15:10   Dg Chest Port 1 View  Result Date: 06/10/2018 CLINICAL DATA:  Pneumonia EXAM: PORTABLE CHEST 1 VIEW COMPARISON:  06/08/2018 FINDINGS: Endotracheal tube with tip 3.4 cm above the carina, RIGHT PICC line with tip overlying the RIGHT atrium and NG tube entering the stomach noted. This is a low volume study with slightly increasing bibasilar opacities/atelectasis. No pneumothorax. No other significant change. IMPRESSION: Low volume study with slightly increased bibasilar opacities/atelectasis. RIGHT PICC line with tip overlying the RIGHT atrium. No other significant change. Electronically Signed   By: Harmon PierJeffrey  Hu M.D.   On: 06/10/2018 07:09  ASSESSMENT AND PLAN:  57 y m with acute stroke  * Acute Stroke left lateral medullary infarct - ASA + Statin - echo wnl  * Acute respiratory failure intubated for airway protection with new onset CVA - weaning per PCCM -patient was re-intubated 1122 2019 secondary to unable to tolerate secretions and poor gag reflex. -ICU attending to discuss with family for possible trach and peg. Pt is LTACH appropriate per ICU MD and [facility] rep to come and evaluate pt and d/w family  *Aspiration pneumonia with  right bibasilar airspace disease -Empiric Unasyn.  * Hypotension: resolved  * Hyperglycemia - monitor sugars  D/w Dr Duanne Limerick All the records are reviewed and case discussed with Care Management/Social Worker. Management plans discussed with the patient, nursing and they are in agreement.  CODE STATUS: Full Code  TOTAL TIME TAKING CARE OF THIS PATIENT: 25 minutes.   More than 50% of the time was spent in counseling/coordination of care: YES  POSSIBLE D/C IN few DAYS, DEPENDING ON CLINICAL CONDITION.   Enedina Finner M.D on 06/10/2018 at 12:16 PM  Between 7am to 6pm - Pager - [phone]  After 6pm go to [email or web address] - password [email or web address] [facility]  [employer] [facility]  Office  [phone]  CC: Primary care physician; Delia Heady, MD  Note: This dictation was prepared with Dragon dictation along with smaller phrase technology. Any transcriptional errors that result from this process are unintentional.

## 2018-06-10 NOTE — Progress Notes (Signed)
EKG performed. Per Dr Duanne LimerickSamaan QTC below 500. No additional orders.

## 2018-06-10 NOTE — Care Management (Signed)
RNCM was consulted for possible LTACH transfer. Patient has had to be re intubated and seems appropriate for LTACH. Family has done some research on facilities in this area as well as [place] since that is where they are from. Son and spouse at bedside. They would currently like to tour facilities but are open to speaking with a rep. Their current preference is [facility]. I have contacted Irving Burtonmily the rep for [facility] and she plans to speak with the family at length today regarding details of the care they can provide. With families permission I have provided them the contact info and they will coordinate a meet time. RNCM will continue to assist in the disposition planning process.

## 2018-06-10 NOTE — Care Management (Signed)
Patient will go to Room 415. Report # is [phone]) [phone]. Accepting Dr Eloisa NorthernSaad Amin.

## 2018-06-10 NOTE — Evaluation (Signed)
Physical Therapy Evaluation Patient Details Name: Dillon Bradley MRN: [id number] DOB: 1950/12/21 Today's Date: 06/10/2018   History of Present Illness  pt is a 67 y.o M admitted on 06/06/2018 with dx of stroke. pt reported initally dizziness which he attributed to an ear infection but develped developed difficulty swallowing or clearing his throat and is speaking with a gurgling sound in ED. Imaging revealed Recent lateral left medullary infarct and numerous chronic lacunar infarcts. He was intubated and extubated but had difficulty coughing and breathign and was reintubated.   Clinical Impression  Spoke with RN was cleared to evaluate pt. He is currently intubated and has a NG tube placed.  He is accompanied by his son at bedside throughout evaluation. Patient currently denies pain but per son he has a hx of arthritis. He demonstrates moderate strength changes comparing R UE/LE to L with R being 4-/5 strength.   Pt currently placed on strict bed rest so focused on bil UE/ LE ROM and manual resistance. He is able to follow commands but is hard of hearing and fatigued requiring frequent cues to stay on task. No pain reported end of session. He would benefit from physical therapy to maintain current level of function and progress bed mobility as strict bed rest precaution is lifted. Once discharged he would benefit from continued physical therapy and via [facility] which as been discussed by case management.     Follow Up Recommendations LTACH    Equipment Recommendations  Rolling walker with 5" wheels    Recommendations for Other Services OT consult     Precautions / Restrictions Restrictions Weight Bearing Restrictions: No      Mobility  Bed Mobility               General bed mobility comments: not assessed due to strick bed precautions  Transfers                 General transfer comment: not assessed due to strict bed precautions  Ambulation/Gait Ambulation/Gait assistance:  (not assessed)              Stairs            Wheelchair Mobility    Modified Rankin (Stroke Patients Only) Modified Rankin (Stroke Patients Only) Modified Rankin: Severe disability     Balance                                             Pertinent Vitals/Pain Pain Assessment: No/denies pain    Home Living Family/patient expects to be discharged to:: Skilled nursing facility                 Additional Comments: getting set up at [facility] and looking at places locally as well as in [place]    Prior Function Level of Independence: Independent(per son)               Hand Dominance   Dominant Hand: Left    Extremity/Trunk Assessment   Upper Extremity Assessment Upper Extremity Assessment: Defer to OT evaluation    Lower Extremity Assessment Lower Extremity Assessment: RLE deficits/detail;LLE deficits/detail RLE Deficits / Details: overall 4-/5 in all planes LLE Deficits / Details: overall general LLE 4/5       Communication   Communication: Other (comment)(intubated)  Cognition Arousal/Alertness: Lethargic Behavior During Therapy: WFL for tasks assessed/performed;Restless Overall Cognitive Status: Within Functional  Limits for tasks assessed                                        General Comments      Exercises Other Exercises Other Exercises: bil UE gripping, elbow flexion/ extension with reistance, shoulder flexion/ extension with resistance x 10 ea.(intermittent verbal cues to stay focused) Other Exercises: bil LE, hip flexion/ extension, ankle DF/ PF, and hip abd/ add x 10 ea with manual resistance(intermittent cues needed to stay focused)   Assessment/Plan    PT Assessment Patient needs continued PT services  PT Problem List Decreased strength;Decreased range of motion;Decreased activity tolerance;Decreased balance;Decreased mobility;Decreased coordination;Decreased safety awareness;Decreased  knowledge of use of DME       PT Treatment Interventions Therapeutic activities;Therapeutic exercise;Functional mobility training;Neuromuscular re-education;Manual techniques;Patient/family education;Gait training;DME instruction    PT Goals (Current goals can be found in the Care Plan section)  Acute Rehab PT Goals Patient Stated Goal: per son: to get better PT Goal Formulation: With patient/family Time For Goal Achievement: 06/24/18 Potential to Achieve Goals: Fair    Frequency Min 2X/week   Barriers to discharge Decreased caregiver support;Inaccessible home environment      Co-evaluation               AM-PAC PT "6 Clicks" Mobility  Outcome Measure Help needed turning from your back to your side while in a flat bed without using bedrails?: Total Help needed moving from lying on your back to sitting on the side of a flat bed without using bedrails?: Total Help needed moving to and from a bed to a chair (including a wheelchair)?: Total Help needed standing up from a chair using your arms (e.g., wheelchair or bedside chair)?: Total Help needed to walk in hospital room?: Total Help needed climbing 3-5 steps with a railing? : Total 6 Click Score: 6    End of Session Equipment Utilized During Treatment: Oxygen;Other (comment) Activity Tolerance: Patient tolerated treatment well;Patient limited by lethargy;Patient limited by fatigue Patient left: in bed;with family/visitor present Nurse Communication: (how session went) PT Visit Diagnosis: Muscle weakness (generalized) (M62.81);Other symptoms and signs involving the nervous system (R29.898);Difficulty in walking, not elsewhere classified (R26.2);Dizziness and giddiness (R42);Other abnormalities of gait and mobility (R26.89)    Time: [id number] PT Time Calculation (min) (ACUTE ONLY): 49 min   Charges:   PT Evaluation $PT Eval Moderate Complexity: 1 Mod PT Treatments $Therapeutic Exercise: 38-52 mins        Laylee Schooley PT, DPT, LAT, ATC  06/10/18  12:21 PM       Verdie Barrows 06/10/2018, 12:12 PM

## 2018-06-10 NOTE — Progress Notes (Signed)
Patient being tx to [facility] room [facility]. Accepting physician Dr Eloisa NorthernSaad Amin. Report called to RN Zeb ComfortBarbara Johnson. Patient is alert and following commands. No complaints of pain. Foley intact, tolerating diet. PICC line in place. Report given to Care link Renae Fickleaul [phone]. Wife and son at bedside.

## 2018-06-10 NOTE — Progress Notes (Addendum)
Name: Dillon Bradley MRN: [id number] DOB: 09-08-1950     CONSULTATION DATE: 06/06/2018  Subjective & objectives: Fentanyl drip.  Remains on ventilator and awaiting LTAC placement.  PAST MEDICAL HISTORY :   has a past medical history of Arthritis, Chronic pain, Hypertension, Legg-Calve-Perthes disease, and Psoriasis.  has a past surgical history that includes Nose surgery. Prior to Admission medications   Medication Sig Start Date End Date Taking? Authorizing Provider  acetaminophen (TYLENOL) 325 MG tablet Take 650 mg by mouth every 6 (six) hours as needed.   Yes [provider]  calcium-vitamin D (OSCAL WITH D) 500-200 MG-UNIT tablet Take 1 tablet by mouth daily.   Yes [provider]  Coenzyme Q10 (COQ10) 100 MG CAPS Take 1 capsule by mouth daily.   Yes [provider]  diclofenac (VOLTAREN) 75 MG EC tablet Take 75 mg by mouth 2 (two) times daily.   Yes [provider]  Multiple Vitamins-Minerals (MULTIVITAMIN ADULTS 50+) TABS Take 1 tablet by mouth daily.   Yes [provider]   Allergies  Allergen Reactions  . Aspirin     Pt overdosed on ASA fifty years ago.    FAMILY HISTORY:  family history includes Arthritis in his brother; Colon cancer in his father; Diabetes in his brother; Hypertension in his father and mother; Kidney cancer in his father; Ovarian cancer in his sister; Stroke in his mother. SOCIAL HISTORY:  reports that he has been smoking cigars. He has never used smokeless tobacco. He reports that he drinks about 4.0 standard drinks of alcohol per week. He reports that he does not use drugs.  REVIEW OF SYSTEMS:   Unable to obtain due to critical illness   VITAL SIGNS: Temp:  [98.1 F (36.7 C)-99 F (37.2 C)] 98.1 F (36.7 C) (11/24 0800) Pulse Rate:  [47-88] 67 (11/24 1000) Resp:  [13-20] 13 (11/24 1000) BP: (99-152)/(66-111) 149/76 (11/24 1000) SpO2:  [97 %-100 %] 100 % (11/24 1000) FiO2 (%):  [35 %-45 %] 35 %  (11/24 0912)  Physical Examination: Rass of -2 and following simple commands.  No spontaneous cough, weak gag reflex and equal motor power 4 extremities On a ventilator, no distress, bilateral equal air entry with no adventitious sounds S1 & S2 are audible with no murmur Benign abdominal exam with normal peristalsis No edema  ASSESSMENT / PLAN:  Ventilator dependent respiratory failure, extubated on 06/07/2018 and had to be reintubed because of unable to protect airway with desaturation. Continues to have poor weaning parameter because of no spontaneous cough when awake and weak gag reflex. -Family agreed to trach and PEG if he continues to have poor weaning parameter by next week. -Noticed to have anxiety, delirium, insomnia with episodes of restlessness.  Optimize Seroquel and Klonopin as advised by neurology and avoid short-term benzodiazepine  New onset CVA with left lateral medullary infarct (stable on follow-up CT 06/09/2012). No large vessel occlusion with cerebral angiogram. ECHO 06/07/2018 LVEF 60-65% and normal wall motion *Continues to have vertigo and disequilibrium symptoms which is explained about the signs of the stroke -Aspirin + statin -Monitor neuro status and management as per neurology  Aspiration pneumonia. bibasilarairspace disease. MRSA PCR -ve -Empiric Unasyn. Monitor CXR + CBC + FiO2  Hypertension -Optimize antihypertensives and monitor hemodynamics  Prerenal azotemia with intravascular volume depletion -Optimize volume, monitor renal panel and urine output  Anemia -Keep hemoglobin more than 7 g/dL  Full code  DVT & GI prophylaxis. Continue with supportive care Awaiting LTAC placement  Critical care time 

## 2018-06-10 NOTE — Progress Notes (Signed)
Subjective: Patient is re intubated due to significant secretions.    Objective: Current vital signs: BP (!) 149/76   Pulse 67   Temp 98.1 F (36.7 C)   Resp 13   Ht 5' 8"  (1.727 m)   Wt 98.7 kg   SpO2 100%   BMI 33.09 kg/m  Vital signs in last 24 hours: Temp:  [98.1 F (36.7 C)-99 F (37.2 C)] 98.1 F (36.7 C) (11/24 0800) Pulse Rate:  [47-88] 67 (11/24 1000) Resp:  [13-20] 13 (11/24 1000) BP: (99-152)/(66-111) 149/76 (11/24 1000) SpO2:  [97 %-100 %] 100 % (11/24 1000) FiO2 (%):  [35 %-45 %] 35 % (11/24 0912)  Intake/Output from previous day: 11/23 0701 - 11/24 0700 In: 941 [I.V.:791; IV Piggyback:150] Out: 2300 [Urine:1950; Emesis/NG output:350] Intake/Output this shift: No intake/output data recorded. Nutritional status:  Diet Order            Diet NPO time specified  Diet effective now             Neurologic Exam:  Mental Status: Alert,intubated and sedated. Nods appropriately to orientation question, person, date, time, place and situation. Able to communicate through visual chart and writing. Unable to assess speech. Able to follow 3 step commands without difficulty. Attention span and concentration seemed appropriate  Cranial Nerves: II: Discs flat bilaterally; Visual fields grossly normal, pupils equal, round, reactive to light and accommodation III,IV, VI: ptosis not present, extra-ocular motions intact bilaterally V,VII:Unable to assess facial assymetry, facial light touch sensationdecreased on the left VIII: hearing normal bilaterally IX,X: gag reflex present XI: bilateral shoulder shrug XII: midline tongue extension Motor: 5/5 throughout Sensory: Pinprick and light touchintact bilaterally Deep Tendon Reflexes: 2+ and symmetric throughout Plantars: Right:upgoingLeft: mute Cerebellar: Finger-to-nosetesting intact bilaterally.Heel to shin testing normal bilaterally Gait: not tested due to intubation  Lab  Results: Basic Metabolic Panel: Recent Labs  Lab 06/06/18 0634 06/07/18 0430 06/08/18 0618 06/10/18 0613  NA 138 138 140 142  K 4.0 3.9 4.1 3.5  CL 101 105 105 106  CO2 26 26 27 28   GLUCOSE 138* 111* 133* 133*  BUN 16 16 14  26*  CREATININE 0.73 0.84 0.81 0.68  CALCIUM 9.7 8.5* 8.4* 8.7*    Liver Function Tests: Recent Labs  Lab 06/06/18 0634  AST 26  ALT 29  ALKPHOS 77  BILITOT 0.8  PROT 8.1  ALBUMIN 4.2   No results for input(s): LIPASE, AMYLASE in the last 168 hours. No results for input(s): AMMONIA in the last 168 hours.  CBC: Recent Labs  Lab 06/06/18 0634 06/07/18 0430 06/08/18 0618 06/10/18 0613  WBC 16.0* 12.0* 16.7* 9.9  NEUTROABS 13.7*  --  14.9* 7.3  HGB 15.0 13.0 13.2 12.1*  HCT 45.2 40.9 40.7 38.0*  MCV 90.8 94.2 93.1 95.0  PLT 386 342 280 290    Cardiac Enzymes: No results for input(s): CKTOTAL, CKMB, CKMBINDEX, TROPONINI in the last 168 hours.  Lipid Panel: Recent Labs  Lab 06/07/18 0430  CHOL 168  TRIG 116  HDL 48  CHOLHDL 3.5  VLDL 23  LDLCALC 97    CBG: Recent Labs  Lab 06/09/18 0359 06/09/18 0726 06/10/18 0001 06/10/18 0729 06/10/18 1149  GLUCAP 150* 169* 116* 10 104*    Microbiology: Results for orders placed or performed during the hospital encounter of 06/06/18  MRSA PCR Screening     Status: None   Collection Time: 06/06/18 10:17 AM  Result Value Ref Range Status   MRSA by PCR NEGATIVE NEGATIVE  Final    Comment:        The GeneXpert MRSA Assay (FDA approved for NASAL specimens only), is one component of a comprehensive MRSA colonization surveillance program. It is not intended to diagnose MRSA infection nor to guide or monitor treatment for MRSA infections. Performed at [facility], [address] [place], [place] [address]     Coagulation Studies: No results for input(s): LABPROT, INR in the last 72 hours.  Imaging: Ct Head Wo Contrast  Result Date: 06/09/2018 CLINICAL DATA:  Stroke  follow-up.  Lateral left medullary infarct EXAM: CT HEAD WITHOUT CONTRAST TECHNIQUE: Contiguous axial images were obtained from the base of the skull through the vertex without intravenous contrast. COMPARISON:  Brain MRI from 3 days ago FINDINGS: Brain: Known recent lateral left medullary infarct. Background of advanced chronic small vessel ischemia with remote lacunar infarcts in the brainstem, deep white matter, and bilateral deep gray nuclei. No hemorrhage, hydrocephalus, or cortical infarct. Vascular: Atherosclerotic calcification. Skull: Negative Sinuses/Orbits: Negative IMPRESSION: 1. Stable compared to 3 days prior. 2. Recent lateral left medullary infarct. 3. Numerous chronic lacunar infarcts. Electronically Signed   By: Monte Fantasia M.D.   On: 06/09/2018 15:10   Dg Chest Port 1 View  Result Date: 06/10/2018 CLINICAL DATA:  Pneumonia EXAM: PORTABLE CHEST 1 VIEW COMPARISON:  06/08/2018 FINDINGS: Endotracheal tube with tip 3.4 cm above the carina, RIGHT PICC line with tip overlying the RIGHT atrium and NG tube entering the stomach noted. This is a low volume study with slightly increasing bibasilar opacities/atelectasis. No pneumothorax. No other significant change. IMPRESSION: Low volume study with slightly increased bibasilar opacities/atelectasis. RIGHT PICC line with tip overlying the RIGHT atrium. No other significant change. Electronically Signed   By: Margarette Canada M.D.   On: 06/10/2018 07:09   [place] Ekg Site Rite  Result Date: 06/08/2018 If Site Rite image not attached, placement could not be confirmed due to current cardiac rhythm.   Medications:  I have reviewed the patient's current medications. Prior to Admission:  Medications Prior to Admission  Medication Sig Dispense Refill Last Dose  . acetaminophen (TYLENOL) 325 MG tablet Take 650 mg by mouth every 6 (six) hours as needed.   06/05/2018 at 0800  . calcium-vitamin D (OSCAL WITH D) 500-200 MG-UNIT tablet Take 1 tablet by mouth  daily.   06/05/2018 at 0800  . Coenzyme Q10 (COQ10) 100 MG CAPS Take 1 capsule by mouth daily.   06/05/2018 at 0800  . diclofenac (VOLTAREN) 75 MG EC tablet Take 75 mg by mouth 2 (two) times daily.   06/05/2018 at 0800  . Multiple Vitamins-Minerals (MULTIVITAMIN ADULTS 50+) TABS Take 1 tablet by mouth daily.   06/05/2018 at 0800   Scheduled: . aspirin  300 mg Rectal Daily  . chlorhexidine gluconate (MEDLINE KIT)  15 mL Mouth Rinse BID  . feeding supplement (PRO-STAT SUGAR FREE 64)  60 mL Per Tube BID  . fentaNYL (SUBLIMAZE) injection  50 mcg Intravenous Once  . free water  30 mL Per Tube Q4H  . mouth rinse  15 mL Mouth Rinse 10 times per day  . multivitamin  15 mL Per Tube Daily  . sodium chloride flush  10-40 mL Intracatheter Q12H   Patient seen and examined.  Clinical course and management discussed.  Necessary edits performed.  I agree with the above.  Assessment and plan of care developed and discussed below.    Assessment:67 y.o.malewith past medical history of Legg-Calve-Perthes,psoriaticarthritis, hypertension, chronic back and neck  pain, presenting with episodes of vertigo, unsteadiness, nausea/vomiting, slurred speech, difficulty swallowing and left facial numbness.Found to have left lateral medullary infarct on imaging, likely secondary to small vessel disease. Patient failed extubation due to inability to handle secretions and desaturations. He was re-intubated for airway protection. Remains stable with no new neurological findings. Echocardiogram did not show cardiac source of emboli.  - Imaging repeated yesterday as family was concerned for worsening ischemia.  He does have L medullary stroke but no further edema or cerebellar involvement that would cause these symptoms of dysequilibrium.  - Delirium  - Please limit/stop short term benzodiazapine use as will worsen delirium  - Start Klonopin.   - Decrease Fentanyl use as at very high dose and we are treating agitation  rather then pain  - Seroquel start 25 QHS, if doesn't help increase to 50qhs with possibility of adding 25 daily   - EKG to look for QTC. If >500 would need to hold Seroquel - Respiratory  - Pt will benefit from early trach.  - He is on CPAP but has significant bulbar symptoms  - Would call for trach/peg consult as likely very low chance of early extubation   - after trach will likely be early candidate for TCT.  Placement  - Would prefer trach/peg done in hospital as family still deciding on placement  - Social work for placement assistance.

## 2018-06-10 NOTE — Progress Notes (Signed)
Resumed previous PRVC settings.   

## 2018-06-10 NOTE — Progress Notes (Signed)
Haywood Lassoaul Rn care-link arrived. Will be transporting patient.

## 2018-06-10 NOTE — Care Management Note (Signed)
Case Management Note  Patient Details  Name: Dillon Bradley MRN: [id number] Date of Birth: Mar 02, 1951  Subjective/Objective:   After deliberation patient to transfer to [facility] for Long term acute care. Patient has been safely cleared by ICU MD. Patients family has met with Raquel Sarna from [facility] to discuss transfer details and son is currently touring the facility. There has been an accepting Physician and room number. [facility] will be used for transport, [facility] report packet placed on the chart. Med necessity on chart. MD to fill out EMTALA. Communicated this plan to RN and she will be able to call report once family is ready.                  Action/Plan:   Expected Discharge Date:                  Expected Discharge Plan:  Acute to Acute Transfer  In-House Referral:     Discharge planning Services  CM Consult  Post Acute Care Choice:  Long Term Acute Care (LTAC) Choice offered to:     DME Arranged:    DME Agency:     HH Arranged:    HH Agency:     Status of Service:  In process, will continue to follow  If discussed at Long Length of Stay Meetings, dates discussed:    Additional Comments:  Latanya Maudlin, RN 06/10/2018, 3:26 PM

## 2018-06-10 NOTE — Discharge Summary (Signed)
Name: Dillon Bradley MRN: [id number] DOB: September 25, 1950     CONSULTATION DATE: 06/06/2018  HISTORY OF PRESENT ILLNESS:   This is a 67 yo male with a PMH of Psoriasis, Legg Calve Perthes Disease, HTN, Chronic Pain, and Arthritis.  She presented to [facility] ER via EMS on 11/20 with acute onset of severe dizziness.  Per ER notes the pts wife reported the pt had not been "acting right" for approximately 17 hrs prior to presentation.  He initially developed dizziness and imbalance primarily swaying to the right.  He also had difficulty swallowing/clearing his throat causing a gurgling sound, difficulty speaking, trouble finding his words, nausea/vomiting, and left facial numbness.  His symptoms worsened the night of 11/19, however the pt initially refused to come to the ER due to worsening symptoms pts wife notified EMS.  Upon arrival to the ER pt continued to have difficulty with speech and word finding.  He was also unable to clear his throat and control his secretions, therefore he was mechanically intubated for airway protection.  Code stroke initiated by ER physician, tele-neurology consulted.  Initial CT head revealed no acute intracranial abnormality APECTS 10, initial NIH stroke scale 3.  Per tele-neurology pt deemed not a candidate for tPA or interventional candidate as symptoms not consistent with a large vessel proximal occlusion. He was subsequently admitted to ICU for additional workup and treatment. Patient was extubated on 06/07/2018 and had to be reintubated in the same day because of being unable to protect his airway was aspirations.  He is difficult extubation. Patient continued to have no spontaneous cough and very weak gag reflex and developed ventilator dependency.  PAST MEDICAL HISTORY :   has a past medical history of Arthritis, Chronic pain, Hypertension, Legg-Calve-Perthes disease, and Psoriasis.  has a past surgical history that includes Nose surgery. Prior to Admission medications     Medication Sig Start Date End Date Taking? Authorizing Provider  acetaminophen (TYLENOL) 325 MG tablet Take 650 mg by mouth every 6 (six) hours as needed.   Yes [provider]  calcium-vitamin D (OSCAL WITH D) 500-200 MG-UNIT tablet Take 1 tablet by mouth daily.   Yes [provider]  Coenzyme Q10 (COQ10) 100 MG CAPS Take 1 capsule by mouth daily.   Yes [provider]  diclofenac (VOLTAREN) 75 MG EC tablet Take 75 mg by mouth 2 (two) times daily.   Yes [provider]  Multiple Vitamins-Minerals (MULTIVITAMIN ADULTS 50+) TABS Take 1 tablet by mouth daily.   Yes [provider]  Amino Acids-Protein Hydrolys (FEEDING SUPPLEMENT, PRO-STAT SUGAR FREE 64,) LIQD Place 60 mLs into feeding tube 2 (two) times daily. 06/10/18   Uvaldo RisingSamaan, Jaydin Boniface, MD  Ampicillin-Sulbactam 3 g in sodium chloride 0.9 % 100 mL Inject 3 g into the vein every 6 (six) hours. 06/10/18   Uvaldo RisingSamaan, Emylia Latella, MD  aspirin 300 MG suppository Place 1 suppository (300 mg total) rectally daily. 06/11/18   Uvaldo RisingSamaan, Tekelia Kareem, MD  clonazePAM (KLONOPIN) 1 MG tablet Take 1 tablet (1 mg total) by mouth 2 (two) times daily as needed (agitation/delirium). 06/10/18   Uvaldo RisingSamaan, Dillen Belmontes, MD  dexmedetomidine (PRECEDEX) 400 MCG/100ML SOLN Inject 19.74-69.09 mcg/hr into the vein continuous. 06/10/18   Uvaldo RisingSamaan, Carlyn Lemke, MD  famotidine (PEPCID) 20-0.9 MG/50ML-% Inject 50 mLs (20 mg total) into the vein every 12 (twelve) hours. 06/10/18   Uvaldo RisingSamaan, Alee Gressman, MD  fentaNYL 10 mcg/ml SOLN infusion Inject 0-400 mcg/hr into the vein continuous. 06/10/18   Uvaldo RisingSamaan, Joron Velis, MD  Multiple Vitamin (MULTIVITAMIN)  LIQD Place 15 mLs into feeding tube daily. 06/11/18   Uvaldo Rising, MD  Nutritional Supplements (FEEDING SUPPLEMENT, VITAL HIGH PROTEIN,) LIQD liquid Place 1,000 mLs into feeding tube continuous. 06/10/18   Uvaldo Rising, MD  QUEtiapine (SEROQUEL) 25 MG tablet Take 1 tablet (25 mg total) by mouth at bedtime. 06/10/18   Uvaldo Rising, MD   senna-docusate (SENOKOT-S) 8.6-50 MG tablet Take 1 tablet by mouth at bedtime as needed for moderate constipation. 06/10/18   Uvaldo Rising, MD  Water For Irrigation, Sterile (FREE WATER) SOLN Place 30 mLs into feeding tube every 4 (four) hours. 06/10/18   Uvaldo Rising, MD   Allergies  Allergen Reactions  . Aspirin     Pt overdosed on ASA fifty years ago.    FAMILY HISTORY:  family history includes Arthritis in his brother; Colon cancer in his father; Diabetes in his brother; Hypertension in his father and mother; Kidney cancer in his father; Ovarian cancer in his sister; Stroke in his mother. SOCIAL HISTORY:  reports that he has been smoking cigars. He has never used smokeless tobacco. He reports that he drinks about 4.0 standard drinks of alcohol per week. He reports that he does not use drugs.  REVIEW OF SYSTEMS:   Unable to obtain due to critical illness   VITAL SIGNS: Temp:  [98.1 F (36.7 C)-99 F (37.2 C)] 98.7 F (37.1 C) (11/24 1200) Pulse Rate:  [47-88] 59 (11/24 1600) Resp:  [13-20] 16 (11/24 1600) BP: (99-152)/(65-111) 121/100 (11/24 1600) SpO2:  [99 %-100 %] 100 % (11/24 1600) FiO2 (%):  [35 %-45 %] 35 % (11/24 1415)  Physical Examination: Rass of -2 and following simple commands. No spontaneous cough,weak gag reflex and equal motor power 4 extremities On a ventilator, no distress, bilateral equal air entry with no adventitious sounds S1 & S2 are audible with no murmur Benign abdominal exam with normal peristalsis No edema  Course of admission:  Ventilator dependent respiratory failure, extubated on 06/07/2018 and had to be reintubed because of unable to protect airway with desaturation. Continues to have poor weaning parameter because of no spontaneous cough when awake and weak gag reflex. -Family agreed to trach and PEG if he continues to have poor weaning parameter by next week. -Noticed to have anxiety, delirium, insomnia with episodes of restlessness.   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 No large vessel occlusion with cerebral angiogram. ECHO 06/07/2018 LVEF 60-65% and normal wall motion *Continues to have vertigo and disequilibrium symptoms which is explained about the signs of the stroke -Aspirin + statin -Monitor neuro status and management as per neurology  Aspiration pneumonia. bibasilarairspace disease. MRSA PCR -ve -Empiric Unasyn. Monitor CXR + CBC + FiO2  Hypertension -Optimize antihypertensives and monitor hemodynamics  Prerenal azotemia with intravascular volume depletion -Optimize volume, monitor renal panel and urine output  Anemia -Keep hemoglobin more than 7 g/dL  Full code  DVT & GI prophylaxis. Continue with supportive care  LTAC placement

## 2018-06-18 DIAGNOSIS — N170 Acute kidney failure with tubular necrosis: Secondary | ICD-10-CM

## 2018-06-18 DIAGNOSIS — J9621 Acute and chronic respiratory failure with hypoxia: Secondary | ICD-10-CM

## 2018-06-18 DIAGNOSIS — J181 Lobar pneumonia, unspecified organism: Secondary | ICD-10-CM

## 2018-06-18 DIAGNOSIS — I639 Cerebral infarction, unspecified: Secondary | ICD-10-CM

## 2018-06-19 DIAGNOSIS — J9621 Acute and chronic respiratory failure with hypoxia: Secondary | ICD-10-CM

## 2018-06-19 DIAGNOSIS — J181 Lobar pneumonia, unspecified organism: Secondary | ICD-10-CM

## 2018-06-19 DIAGNOSIS — N170 Acute kidney failure with tubular necrosis: Secondary | ICD-10-CM

## 2018-06-19 DIAGNOSIS — I639 Cerebral infarction, unspecified: Secondary | ICD-10-CM

## 2018-06-20 DIAGNOSIS — N170 Acute kidney failure with tubular necrosis: Secondary | ICD-10-CM

## 2018-06-20 DIAGNOSIS — J9621 Acute and chronic respiratory failure with hypoxia: Secondary | ICD-10-CM

## 2018-06-20 DIAGNOSIS — I639 Cerebral infarction, unspecified: Secondary | ICD-10-CM

## 2018-06-20 DIAGNOSIS — J181 Lobar pneumonia, unspecified organism: Secondary | ICD-10-CM

## 2018-06-21 DIAGNOSIS — J181 Lobar pneumonia, unspecified organism: Secondary | ICD-10-CM

## 2018-06-21 DIAGNOSIS — J9621 Acute and chronic respiratory failure with hypoxia: Secondary | ICD-10-CM

## 2018-06-21 DIAGNOSIS — I639 Cerebral infarction, unspecified: Secondary | ICD-10-CM

## 2018-06-21 DIAGNOSIS — N170 Acute kidney failure with tubular necrosis: Secondary | ICD-10-CM

## 2018-06-22 DIAGNOSIS — I639 Cerebral infarction, unspecified: Secondary | ICD-10-CM

## 2018-06-22 DIAGNOSIS — J181 Lobar pneumonia, unspecified organism: Secondary | ICD-10-CM

## 2018-06-22 DIAGNOSIS — N170 Acute kidney failure with tubular necrosis: Secondary | ICD-10-CM

## 2018-06-22 DIAGNOSIS — J9621 Acute and chronic respiratory failure with hypoxia: Secondary | ICD-10-CM

## 2018-06-23 DIAGNOSIS — I639 Cerebral infarction, unspecified: Secondary | ICD-10-CM

## 2018-06-23 DIAGNOSIS — J9621 Acute and chronic respiratory failure with hypoxia: Secondary | ICD-10-CM

## 2018-06-23 DIAGNOSIS — N170 Acute kidney failure with tubular necrosis: Secondary | ICD-10-CM

## 2018-06-23 DIAGNOSIS — J181 Lobar pneumonia, unspecified organism: Secondary | ICD-10-CM

## 2018-06-24 DIAGNOSIS — J9621 Acute and chronic respiratory failure with hypoxia: Secondary | ICD-10-CM

## 2018-06-24 DIAGNOSIS — J181 Lobar pneumonia, unspecified organism: Secondary | ICD-10-CM

## 2018-06-24 DIAGNOSIS — I639 Cerebral infarction, unspecified: Secondary | ICD-10-CM

## 2018-06-24 DIAGNOSIS — N170 Acute kidney failure with tubular necrosis: Secondary | ICD-10-CM

## 2018-07-02 DIAGNOSIS — J9621 Acute and chronic respiratory failure with hypoxia: Secondary | ICD-10-CM

## 2018-07-02 DIAGNOSIS — J181 Lobar pneumonia, unspecified organism: Secondary | ICD-10-CM

## 2018-07-02 DIAGNOSIS — I639 Cerebral infarction, unspecified: Secondary | ICD-10-CM

## 2018-07-02 DIAGNOSIS — N170 Acute kidney failure with tubular necrosis: Secondary | ICD-10-CM

## 2018-07-03 DIAGNOSIS — J181 Lobar pneumonia, unspecified organism: Secondary | ICD-10-CM

## 2018-07-03 DIAGNOSIS — N170 Acute kidney failure with tubular necrosis: Secondary | ICD-10-CM

## 2018-07-03 DIAGNOSIS — I639 Cerebral infarction, unspecified: Secondary | ICD-10-CM

## 2018-07-03 DIAGNOSIS — J9621 Acute and chronic respiratory failure with hypoxia: Secondary | ICD-10-CM

## 2018-07-04 DIAGNOSIS — J181 Lobar pneumonia, unspecified organism: Secondary | ICD-10-CM

## 2018-07-04 DIAGNOSIS — I639 Cerebral infarction, unspecified: Secondary | ICD-10-CM

## 2018-07-04 DIAGNOSIS — N170 Acute kidney failure with tubular necrosis: Secondary | ICD-10-CM

## 2018-07-04 DIAGNOSIS — J9621 Acute and chronic respiratory failure with hypoxia: Secondary | ICD-10-CM

## 2018-07-05 DIAGNOSIS — J9621 Acute and chronic respiratory failure with hypoxia: Secondary | ICD-10-CM

## 2018-07-05 DIAGNOSIS — N170 Acute kidney failure with tubular necrosis: Secondary | ICD-10-CM

## 2018-07-05 DIAGNOSIS — J181 Lobar pneumonia, unspecified organism: Secondary | ICD-10-CM

## 2018-07-05 DIAGNOSIS — I639 Cerebral infarction, unspecified: Secondary | ICD-10-CM

## 2018-07-06 DIAGNOSIS — I639 Cerebral infarction, unspecified: Secondary | ICD-10-CM

## 2018-07-06 DIAGNOSIS — N170 Acute kidney failure with tubular necrosis: Secondary | ICD-10-CM

## 2018-07-06 DIAGNOSIS — J181 Lobar pneumonia, unspecified organism: Secondary | ICD-10-CM

## 2018-07-06 DIAGNOSIS — J9621 Acute and chronic respiratory failure with hypoxia: Secondary | ICD-10-CM

## 2018-07-07 DIAGNOSIS — J9621 Acute and chronic respiratory failure with hypoxia: Secondary | ICD-10-CM

## 2018-07-07 DIAGNOSIS — I639 Cerebral infarction, unspecified: Secondary | ICD-10-CM

## 2018-07-07 DIAGNOSIS — N170 Acute kidney failure with tubular necrosis: Secondary | ICD-10-CM

## 2018-07-07 DIAGNOSIS — J181 Lobar pneumonia, unspecified organism: Secondary | ICD-10-CM

## 2018-07-18 DEATH — deceased

## 2019-07-12 IMAGING — DX DG CHEST 1V PORT
1 series · 1 of 1 positions shown · non-contrast
Comparison: 06/07/2018

CLINICAL DATA: Pneumonia

EXAM:
PORTABLE CHEST 1 VIEW

[chest ap]
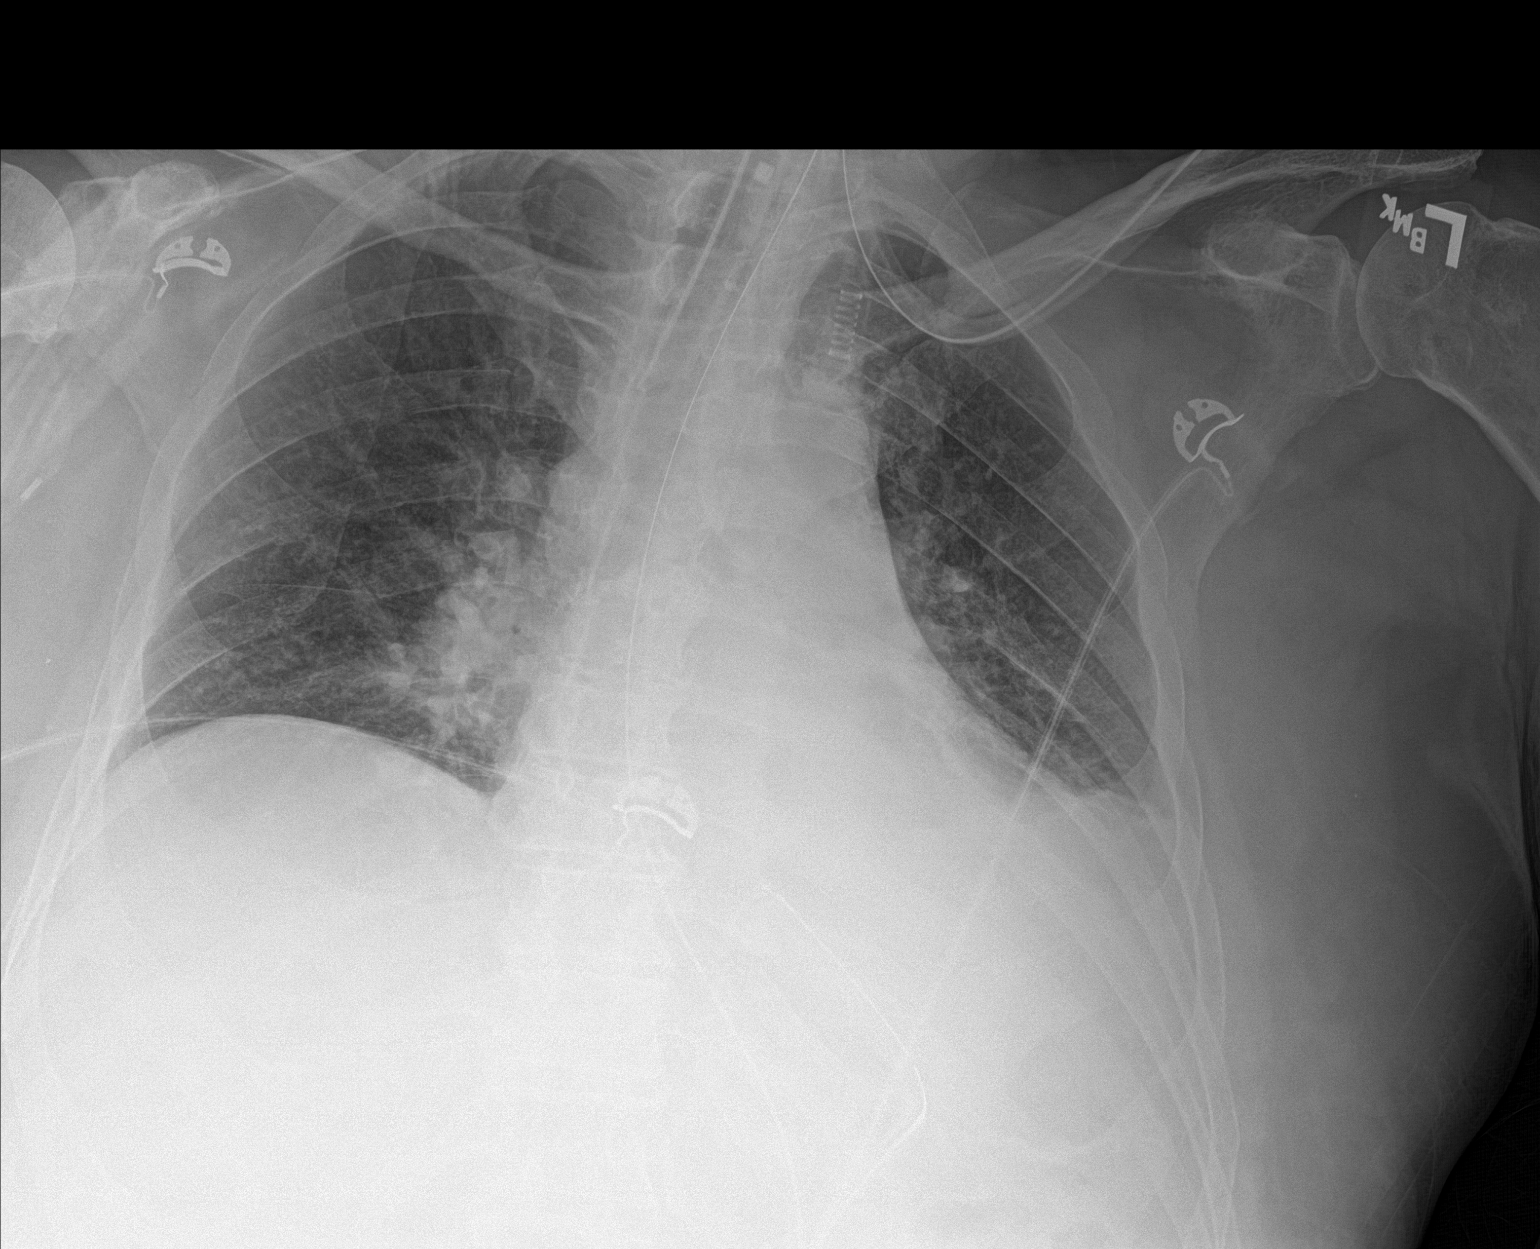

[1 of 1 positions shown; findings below may reference images not displayed]

FINDINGS: Endotracheal tube tip measures 3.5 cm above the carina. Enteric tube
is coiled in the left upper quadrant consistent with location in the
upper stomach. Shallow inspiration with atelectasis in the lung
bases. Consolidation or atelectasis behind the heart on the left is
slightly improved since previous study. Cardiac enlargement with
mild central vascular congestion. No pneumothorax. No blunting of
costophrenic angles.
IMPRESSION: Appliances appear in satisfactory location. Shallow inspiration with
atelectasis in the lung bases. Cardiac enlargement with mild
vascular congestion. Improvement of atelectasis or infiltration in
the left base behind the heart.

## 2019-07-13 IMAGING — CT CT HEAD W/O CM
3 series · 16 of 47 positions shown, 19 images · non-contrast
Comparison: Brain MRI from 3 days ago

CLINICAL DATA: Stroke follow-up.  Lateral left medullary infarct

EXAM:
CT HEAD WITHOUT CONTRAST
TECHNIQUE: Contiguous axial images were obtained from the base of the skull
through the vertex without intravenous contrast.

[Series 3: head wo · axial · 0.42mm/px · z∈[-186,-51]mm · 10 of 33 slices shown, 13 images]
[im 3/33  brain]
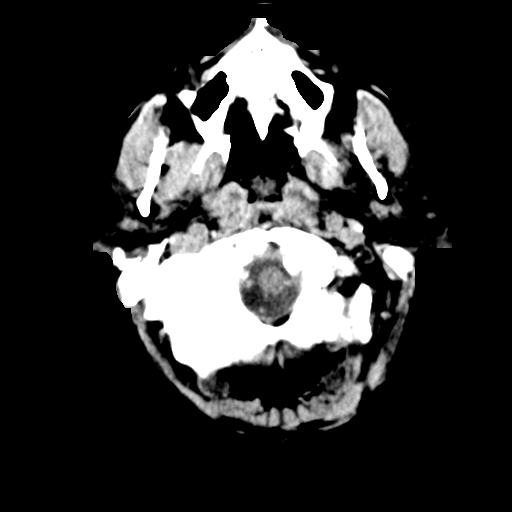
[im 3/33  bone]
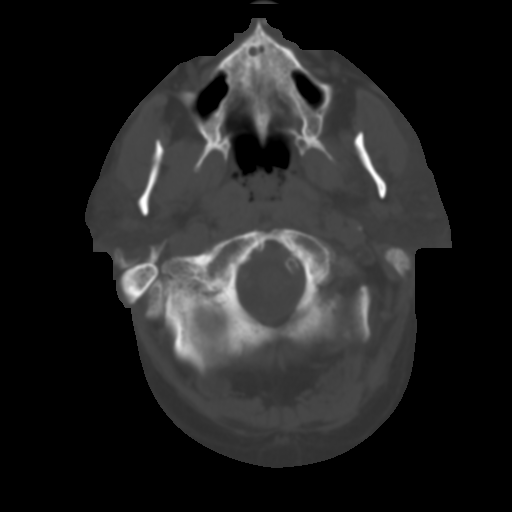
[im 6/33  brain]
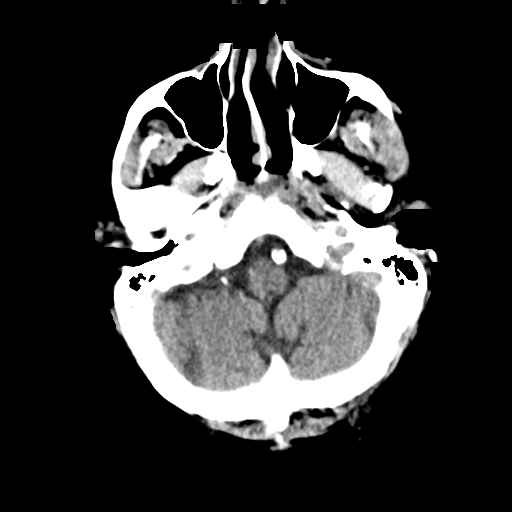
[im 9/33  brain]
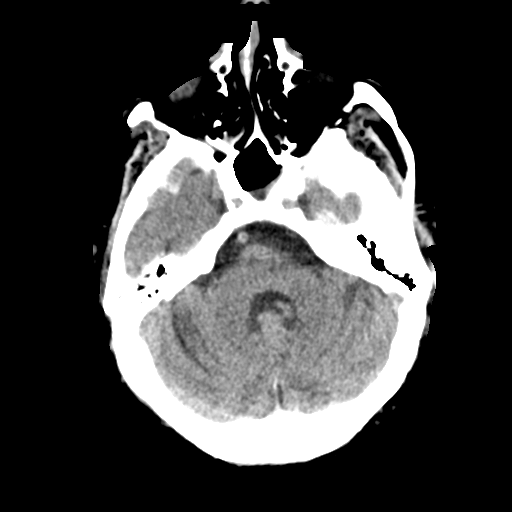
[im 12/33  brain]
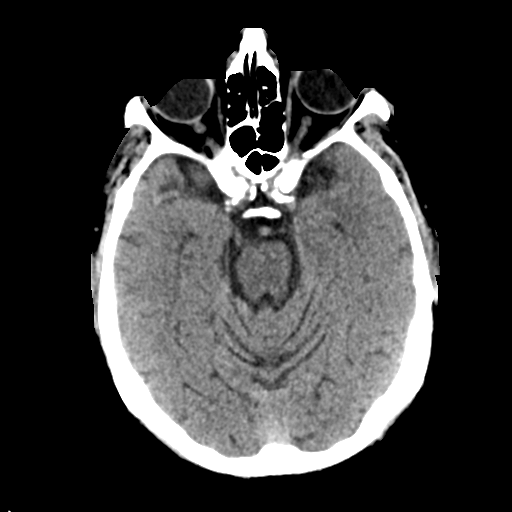
[im 15/33  brain]
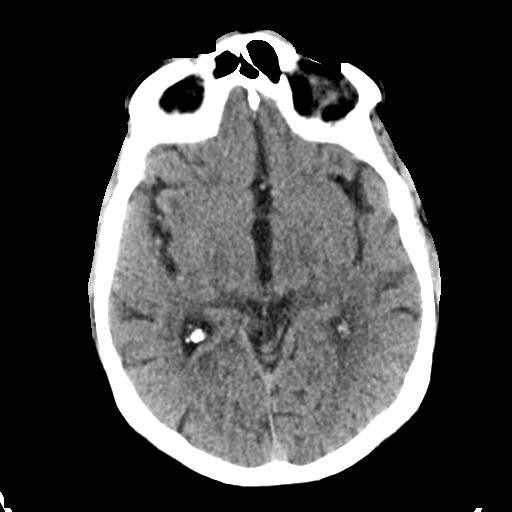
[im 15/33  bone]
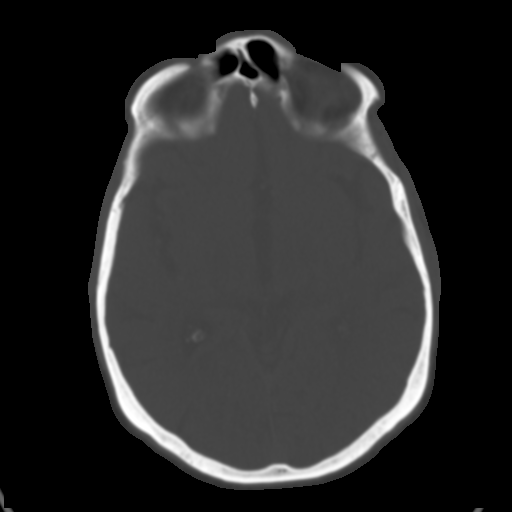
[im 18/33  brain]
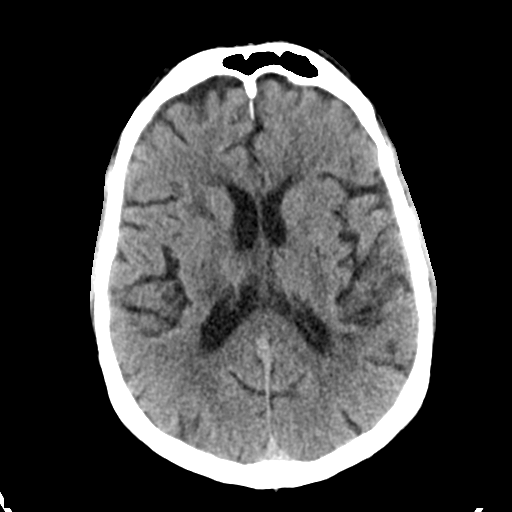
[im 21/33  brain]
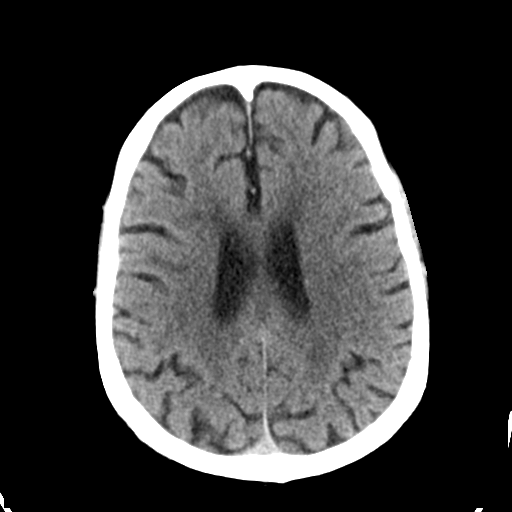
[im 25/33  brain]
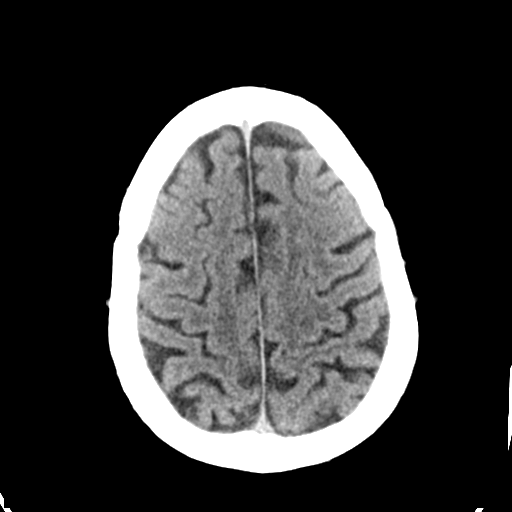
[im 27/33  brain]
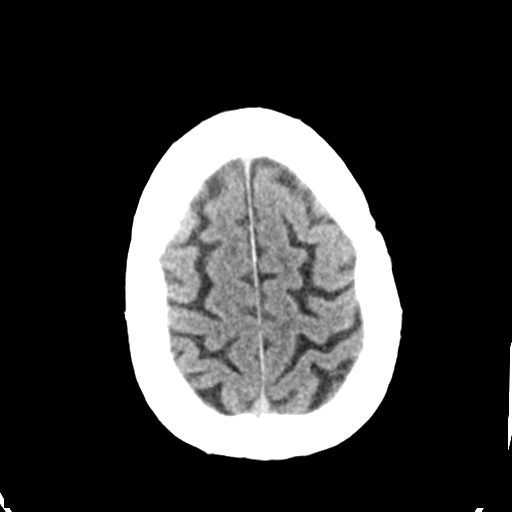
[im 27/33  bone]
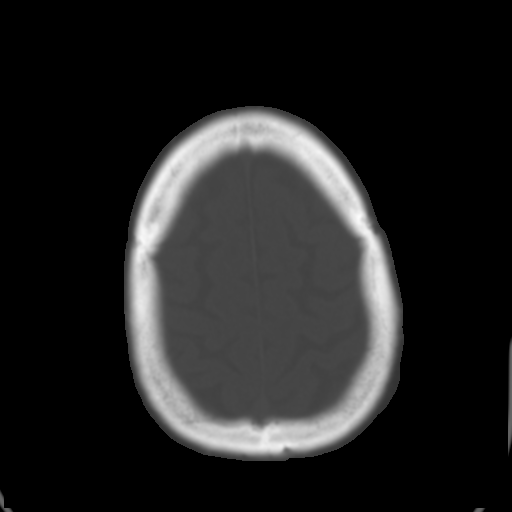
[im 30/33  brain]
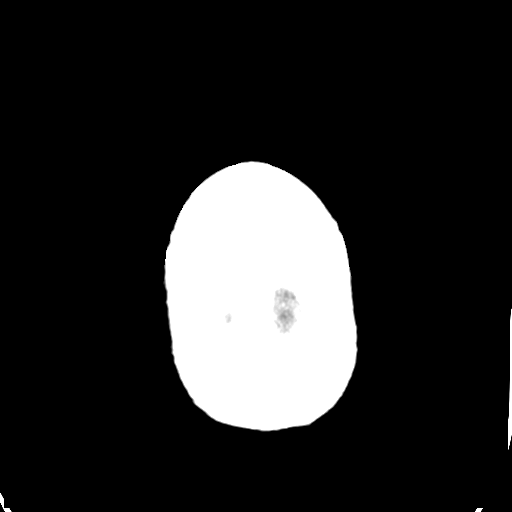

[Series 4: coronal soft tissue · coronal · 0.33mm/px · 3 of 68 slices shown]
[im 23/68  brain]
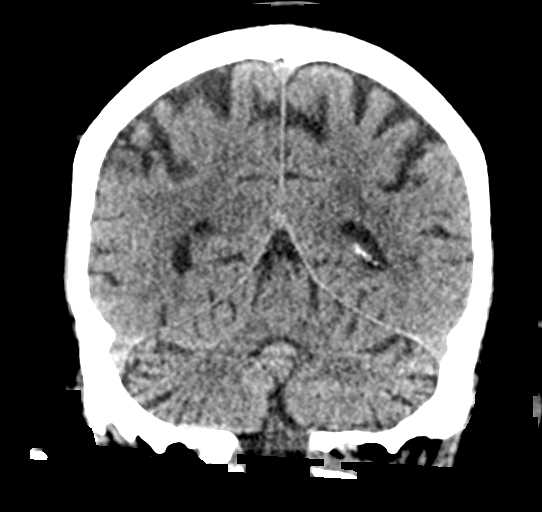
[im 30/68  brain]
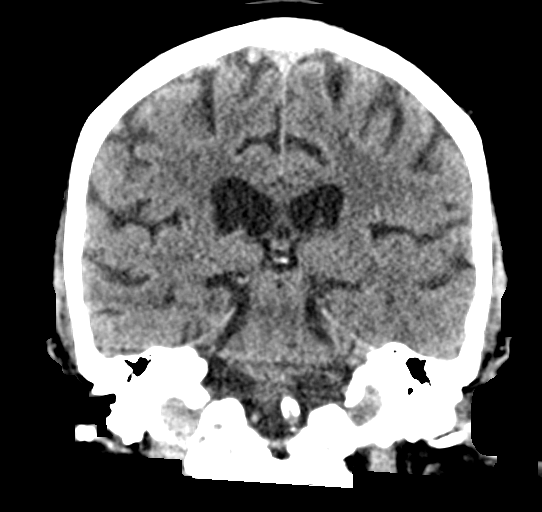
[im 38/68  brain]
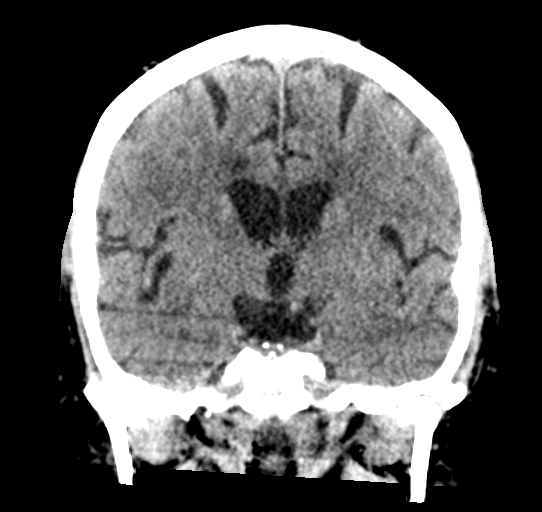

[Series 5: sagittal soft tissue · sagittal · 0.33mm/px · 3 of 56 slices shown]
[im 19/56  brain]
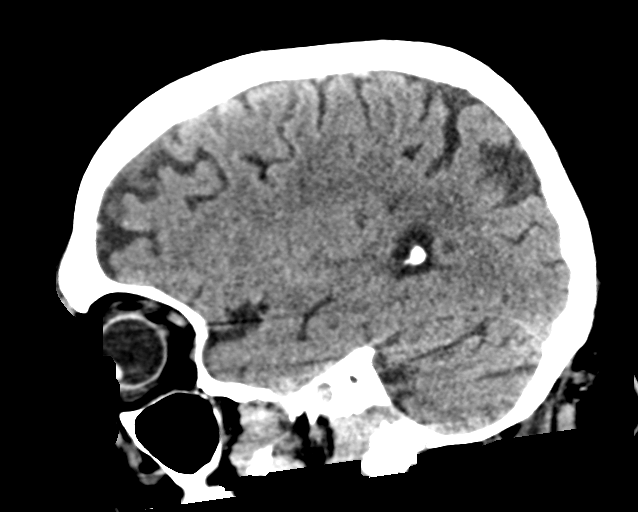
[im 28/56  brain]
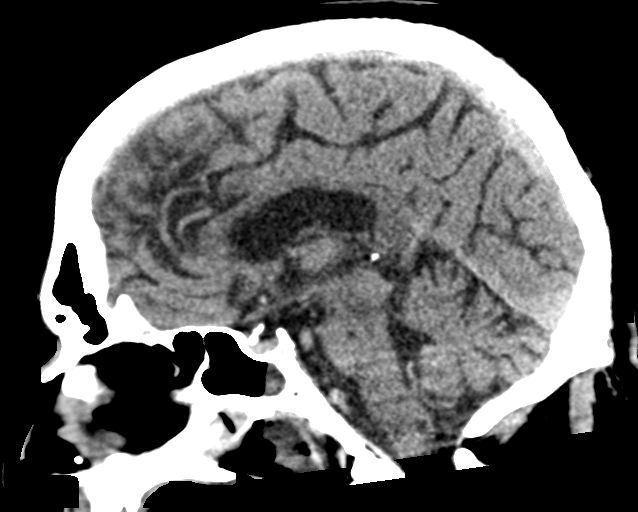
[im 37/56  brain]
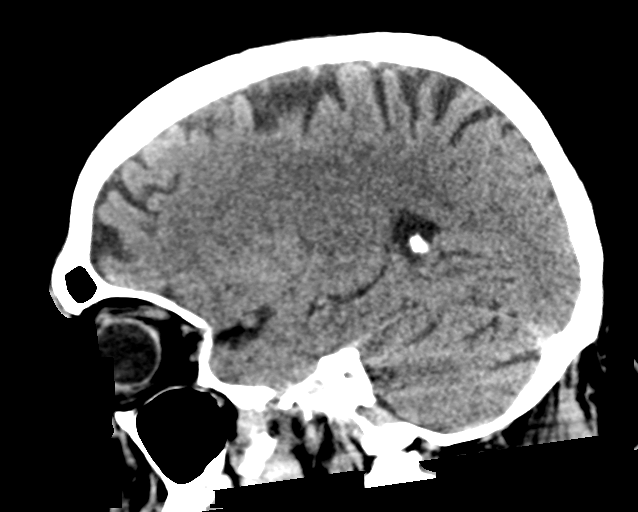

[16 of 47 positions shown; findings below may reference images not displayed]

FINDINGS: Brain: Known recent lateral left medullary infarct. Background of
advanced chronic small vessel ischemia with remote lacunar infarcts
in the brainstem, deep white matter, and bilateral deep gray nuclei.
No hemorrhage, hydrocephalus, or cortical infarct.

Vascular: Atherosclerotic calcification.

Skull: Negative

Sinuses/Orbits: Negative
IMPRESSION: 1. Stable compared to 3 days prior.
2. Recent lateral left medullary infarct.
3. Numerous chronic lacunar infarcts.
